# Patient Record
Sex: Male | Born: 1950 | Race: White | Hispanic: No | Smoking: Former smoker
Health system: Southern US, Community
[De-identification: ages and names within clinical notes are randomized; demographics above are authoritative.]

## PROBLEM LIST (undated history)

## (undated) DIAGNOSIS — I10 Essential (primary) hypertension: Secondary | ICD-10-CM

## (undated) DIAGNOSIS — M549 Dorsalgia, unspecified: Secondary | ICD-10-CM

## (undated) DIAGNOSIS — J449 Chronic obstructive pulmonary disease, unspecified: Secondary | ICD-10-CM

## (undated) DIAGNOSIS — IMO0002 Reserved for concepts with insufficient information to code with codable children: Secondary | ICD-10-CM

## (undated) DIAGNOSIS — F329 Major depressive disorder, single episode, unspecified: Secondary | ICD-10-CM

## (undated) DIAGNOSIS — F32A Depression, unspecified: Secondary | ICD-10-CM

## (undated) DIAGNOSIS — I252 Old myocardial infarction: Secondary | ICD-10-CM

## (undated) DIAGNOSIS — J189 Pneumonia, unspecified organism: Secondary | ICD-10-CM

## (undated) HISTORY — PX: ROTATOR CUFF REPAIR: SHX139

## (undated) HISTORY — PX: NECK SURGERY: SHX720

## (undated) HISTORY — PX: BACK SURGERY: SHX140

## (undated) HISTORY — PX: CORONARY ANGIOPLASTY WITH STENT PLACEMENT: SHX49

---

## 1998-03-17 DIAGNOSIS — I252 Old myocardial infarction: Secondary | ICD-10-CM

## 1998-03-17 HISTORY — DX: Old myocardial infarction: I25.2

## 2011-07-14 ENCOUNTER — Emergency Department (HOSPITAL_COMMUNITY)
Admission: EM | Admit: 2011-07-14 | Discharge: 2011-07-15 | Disposition: A | Discharge status: Home / Self Care | Payer: Medicare Other | Attending: Emergency Medicine | Admitting: Emergency Medicine

## 2011-07-14 ENCOUNTER — Encounter (HOSPITAL_COMMUNITY): Payer: Self-pay | Admitting: Emergency Medicine

## 2011-07-14 DIAGNOSIS — J449 Chronic obstructive pulmonary disease, unspecified: Secondary | ICD-10-CM | POA: Insufficient documentation

## 2011-07-14 DIAGNOSIS — J4489 Other specified chronic obstructive pulmonary disease: Secondary | ICD-10-CM | POA: Insufficient documentation

## 2011-07-14 DIAGNOSIS — R0602 Shortness of breath: Secondary | ICD-10-CM | POA: Insufficient documentation

## 2011-07-14 DIAGNOSIS — M542 Cervicalgia: Secondary | ICD-10-CM | POA: Insufficient documentation

## 2011-07-14 DIAGNOSIS — R062 Wheezing: Secondary | ICD-10-CM | POA: Insufficient documentation

## 2011-07-14 DIAGNOSIS — I252 Old myocardial infarction: Secondary | ICD-10-CM | POA: Insufficient documentation

## 2011-07-14 HISTORY — DX: Chronic obstructive pulmonary disease, unspecified: J44.9

## 2011-07-14 HISTORY — DX: Old myocardial infarction: I25.2

## 2011-07-14 HISTORY — DX: Reserved for concepts with insufficient information to code with codable children: IMO0002

## 2011-07-14 HISTORY — DX: Dorsalgia, unspecified: M54.9

## 2011-07-14 LAB — BASIC METABOLIC PANEL
Calcium: 9.1 mg/dL (ref 8.4–10.5)
GFR calc non Af Amer: >90 mL/min (ref >90)
Potassium: 4.7 mEq/L (ref 3.5–5.1)
Sodium: 136 mEq/L (ref 135–145)

## 2011-07-14 LAB — DIFFERENTIAL
Lymphocytes Relative: 41 % (ref 12–46)
Lymphs Abs: 4.2 10*3/uL — ABNORMAL HIGH (ref 0.7–4.0)
Neutro Abs: 4.5 10*3/uL (ref 1.7–7.7)
Neutrophils Relative %: 44 % (ref 43–77)

## 2011-07-14 LAB — CBC
Hemoglobin: 16 g/dL (ref 13.0–17.0)
Platelets: 170 10*3/uL (ref 150–400)
RBC: 5.22 MIL/uL (ref 4.22–5.81)
WBC: 11.1 10*3/uL — ABNORMAL HIGH (ref 4.0–10.5)

## 2011-07-14 MED ORDER — ALBUTEROL (5 MG/ML) CONTINUOUS INHALATION SOLN
INHALATION_SOLUTION | RESPIRATORY_TRACT | Status: AC
  Administered 2011-07-14: 2.5 mg via RESPIRATORY_TRACT
  Filled 2011-07-14: qty 20

## 2011-07-14 MED ORDER — ALBUTEROL SULFATE (5 MG/ML) 0.5% IN NEBU
15.0000 mg | INHALATION_SOLUTION | Freq: Once | RESPIRATORY_TRACT | Status: AC
  Administered 2011-07-14: 2.5 mg via RESPIRATORY_TRACT
  Filled 2011-07-14: qty 0.5
  Filled 2011-07-14: qty 3

## 2011-07-14 MED ORDER — IPRATROPIUM BROMIDE 0.02 % IN SOLN
1.0000 mg | Freq: Once | RESPIRATORY_TRACT | Status: AC
  Administered 2011-07-14: 0.5 mg via RESPIRATORY_TRACT
  Filled 2011-07-14 (×2): qty 2.5

## 2011-07-14 MED ORDER — PREDNISONE 20 MG PO TABS
60.0000 mg | ORAL_TABLET | Freq: Once | ORAL | Status: AC
  Administered 2011-07-14: 60 mg via ORAL
  Filled 2011-07-14: qty 3

## 2011-07-14 NOTE — Discharge Instructions (Signed)
Chronic Obstructive Pulmonary Disease Chronic obstructive pulmonary disease (COPD) is a condition in which airflow from the lungs is restricted. The lungs can never return to normal, but there are measures you can take which will improve them and make you feel better. CAUSES   Smoking.   Exposure to secondhand smoke.   Breathing in irritants (pollution, cigarette smoke, strong smells, aerosol sprays, paint fumes).   History of lung infections.  TREATMENT  Treatment focuses on making you comfortable (supportive care). Your caregiver may prescribe medications (inhaled or pills) to help improve your breathing. HOME CARE INSTRUCTIONS   If you smoke, stop smoking.   Avoid exposure to smoke, chemicals, and fumes that aggravate your breathing.   Take antibiotic medicines as directed by your caregiver.   Avoid medicines that dry up your system and slow down the elimination of secretions (antihistamines and cough syrups). This decreases respiratory capacity and may lead to infections.   Drink enough water and fluids to keep your urine clear or pale yellow. This loosens secretions.   Use humidifiers at home and at your bedside if they do not make breathing difficult.   Receive all protective vaccines your caregiver suggests, especially pneumococcal and influenza.   Use home oxygen as suggested.   Stay active. Exercise and physical activity will help maintain your ability to do things you want to do.   Eat a healthy diet.  SEEK MEDICAL CARE IF:   You develop pus-like mucus (sputum).   Breathing is more labored or exercise becomes difficult to do.   You are running out of the medicine you take for your breathing.  SEEK IMMEDIATE MEDICAL CARE IF:   You have a rapid heart rate.   You have agitation, confusion, tremors, or are in a stupor (family members may need to observe this).   It becomes difficult to breathe.   You develop chest pain.   You have a fever.  MAKE SURE YOU:    Understand these instructions.   Will watch your condition.   Will get help right away if you are not doing well or get worse.  Document Released: 12/11/2004 Document Revised: 02/20/2011 Document Reviewed: 05/03/2010 [facility] Patient Information 2012 [place], [place]. Use your albuterol inhaler or nebulizer every 4 hours as needed for shortness of breath. Return if needed more than every 4 hours. You need to stop smoking. Call [facility] to get a primary care Doctor locally. Asking you Dr. to help you to stop smoking.

## 2011-07-14 NOTE — ED Notes (Signed)
RT at bedside to administer continuous neb.

## 2011-07-14 NOTE — ED Notes (Signed)
PT reports COPD exacerbation; sating normally right now. PT reports using breathing tx at home but not helping right now. PT is sweaty and wheezing but able to talk without SOB.

## 2011-07-14 NOTE — ED Provider Notes (Addendum)
History     CSN: [id number]  Arrival date & time 07/14/11  1478   First MD Initiated Contact with Patient 07/14/11 2058      Chief Complaint  Patient presents with  . Shortness of Breath    (Consider location/radiation/quality/duration/timing/severity/associated sxs/prior treatment) HPI Complains of wheezing and shortness of breath typical of COPD with nonproductive cough onset one week ago feels like COPD he's had in the past also complains of sinus congestion and ear congestion for approximately one week no known fever. Patient has been treating himself with his home albuterol nebulizers with partial relief. No chest pain no other associated symptoms  Past Medical History  Diagnosis Date  . COPD (chronic obstructive pulmonary disease)   . Disc degeneration   . Back pain   . MI, old     2000; stent placed.     No past surgical history on file.  No family history on file.  History  Substance Use Topics  . Smoking status: Not on file  . Smokeless tobacco: Not on file  . Alcohol Use:     current smoker ,no alcohol no drug  Review of Systems  HENT: Positive for neck pain.        Chronic neck pain patient is currently followed by a pain clinic at [facility], sinus congestion and ear congestion    Allergies  Review of patient's allergies indicates no known allergies.  Home Medications   Current Outpatient Rx  Name Route Sig Dispense Refill  . ALBUTEROL SULFATE (2.5 MG/3ML) 0.083% IN NEBU Nebulization Take 2.5 mg by nebulization every 4 (four) hours as needed. For shortness of breath    . DICLOFENAC SODIUM 75 MG PO TBEC Oral Take 75 mg by mouth 2 (two) times daily.    Marland Kitchen FLUOXETINE HCL 40 MG PO CAPS Oral Take 40 mg by mouth daily.    . GUAIFENESIN 200 MG PO TABS Oral Take 400 mg by mouth every 4 (four) hours as needed. For cough    . IBUPROFEN 200 MG PO TABS Oral Take 400 mg by mouth every 6 (six) hours as needed. For pain    . SIMVASTATIN 20 MG PO TABS  Oral Take 40 mg by mouth every evening.      BP 152/88  Pulse 74  Temp(Src) 98.2 F (36.8 C) (Oral)  SpO2 96%  Physical Exam  Nursing note and vitals reviewed. Constitutional: He appears well-developed and well-nourished. No distress.  HENT:  Head: Normocephalic and atraumatic.  Right Ear: External ear normal.  Left Ear: External ear normal.       Bilateral tympanic membranes normal  Eyes: Conjunctivae are normal. Pupils are equal, round, and reactive to light.  Neck: Neck supple. No tracheal deviation present. No thyromegaly present.  Cardiovascular: Normal rate and regular rhythm.   No murmur heard. Pulmonary/Chest: Effort normal. He has wheezes.       Speaks in paragraphs, prolonged expiratory phase with extra wheeze  Abdominal: Soft. Bowel sounds are normal. He exhibits no distension. There is no tenderness.  Musculoskeletal: Normal range of motion. He exhibits no edema and no tenderness.  Neurological: He is alert. Coordination normal.  Skin: Skin is warm and dry. No rash noted.  Psychiatric: He has a normal mood and affect.    ED Course  Procedures (including critical care time)  Labs Reviewed - No data to display No results found.  Date: 07/14/2011  Rate: 75  Rhythm: normal sinus rhythm  QRS Axis: normal  Intervals: normal  ST/T Wave abnormalities: nonspecific T wave changes  Conduction Disutrbances:none  Narrative Interpretation:   Old EKG Reviewed: none available Results for orders placed during the hospital encounter of 07/14/11  BASIC METABOLIC PANEL      Component Value Range   Sodium 136  135 - 145 (mEq/L)   Potassium 4.7  3.5 - 5.1 (mEq/L)   Chloride 102  96 - 112 (mEq/L)   CO2 22  19 - 32 (mEq/L)   Glucose, Bld 85  70 - 99 (mg/dL)   BUN 25 (*) 6 - 23 (mg/dL)   Creatinine, Ser 1.19  0.50 - 1.35 (mg/dL)   Calcium 9.1  8.4 - 14.7 (mg/dL)   GFR calc non Af Amer >90  >90 (mL/min)   GFR calc Af Amer >90  >90 (mL/min)  CBC      Component Value Range     WBC 11.1 (*) 4.0 - 10.5 (K/uL)   RBC 5.22  4.22 - 5.81 (MIL/uL)   Hemoglobin 16.0  13.0 - 17.0 (g/dL)   HCT 82.9  56.2 - 13.0 (%)   MCV 87.4  78.0 - 100.0 (fL)   MCH 30.7  26.0 - 34.0 (pg)   MCHC 35.1  30.0 - 36.0 (g/dL)   RDW 86.5  78.4 - 69.6 (%)   Platelets 170  150 - 400 (K/uL)  DIFFERENTIAL      Component Value Range   Neutrophils Relative 44  43 - 77 (%)   Neutro Abs 4.5  1.7 - 7.7 (K/uL)   Lymphocytes Relative 41  12 - 46 (%)   Lymphs Abs 4.2 (*) 0.7 - 4.0 (K/uL)   Monocytes Relative 8  3 - 12 (%)   Monocytes Absolute 0.8  0.1 - 1.0 (K/uL)   Eosinophils Relative 6 (*) 0 - 5 (%)   Eosinophils Absolute 0.6  0.0 - 0.7 (K/uL)   Basophils Relative 1  0 - 1 (%)   Basophils Absolute 0.1  0.0 - 0.1 (K/uL)   No results found.   No diagnosis found.  11:25 PM breathing is normal per patient. Patient speaks in paragraphs lungs clear to auscultation after treatment with albuterol and Atrovent nebulizer and prednisone MDM  Plan patient encouraged to stop smoking Prescription 12 day prednisone taper Touse albuterol nebulizer every 4 hours return if needed more than every 4 hours Referral [facility] Diagnosis #1 exacerbation COPD #2 tobacco abuse      Doug Sou, MD 07/14/11 2952  Doug Sou, MD 07/15/11 [id number]

## 2011-07-14 NOTE — ED Notes (Signed)
PT seen at [facility] hospital and was at pain clinic but no longer.

## 2011-07-14 NOTE — ED Notes (Signed)
Pt states that he has no doctor right now and his COPD "is acting up" and he needs steroids. Pt states he used 5 breathing treatments today and experienced no relief. Is wheezing but does not appear SOB.

## 2016-06-15 DIAGNOSIS — J189 Pneumonia, unspecified organism: Secondary | ICD-10-CM

## 2016-06-15 HISTORY — DX: Pneumonia, unspecified organism: J18.9

## 2016-07-21 ENCOUNTER — Inpatient Hospital Stay (HOSPITAL_COMMUNITY)
Admission: EM | Admit: 2016-07-21 | Discharge: 2016-07-24 | DRG: 191 | Disposition: A | Discharge status: Home / Self Care | Payer: Medicare Other | Attending: Family Medicine | Admitting: Family Medicine

## 2016-07-21 ENCOUNTER — Emergency Department (HOSPITAL_COMMUNITY): Payer: Medicare Other

## 2016-07-21 ENCOUNTER — Encounter (HOSPITAL_COMMUNITY): Payer: Self-pay | Admitting: *Deleted

## 2016-07-21 DIAGNOSIS — E871 Hypo-osmolality and hyponatremia: Secondary | ICD-10-CM

## 2016-07-21 DIAGNOSIS — G8929 Other chronic pain: Secondary | ICD-10-CM | POA: Diagnosis present

## 2016-07-21 DIAGNOSIS — I2510 Atherosclerotic heart disease of native coronary artery without angina pectoris: Secondary | ICD-10-CM | POA: Diagnosis present

## 2016-07-21 DIAGNOSIS — Z79891 Long term (current) use of opiate analgesic: Secondary | ICD-10-CM | POA: Diagnosis not present

## 2016-07-21 DIAGNOSIS — F329 Major depressive disorder, single episode, unspecified: Secondary | ICD-10-CM | POA: Diagnosis present

## 2016-07-21 DIAGNOSIS — Z66 Do not resuscitate: Secondary | ICD-10-CM | POA: Diagnosis present

## 2016-07-21 DIAGNOSIS — M479 Spondylosis, unspecified: Secondary | ICD-10-CM | POA: Diagnosis present

## 2016-07-21 DIAGNOSIS — Z955 Presence of coronary angioplasty implant and graft: Secondary | ICD-10-CM | POA: Diagnosis not present

## 2016-07-21 DIAGNOSIS — F1120 Opioid dependence, uncomplicated: Secondary | ICD-10-CM

## 2016-07-21 DIAGNOSIS — M1990 Unspecified osteoarthritis, unspecified site: Secondary | ICD-10-CM | POA: Diagnosis present

## 2016-07-21 DIAGNOSIS — F419 Anxiety disorder, unspecified: Secondary | ICD-10-CM | POA: Diagnosis present

## 2016-07-21 DIAGNOSIS — Z79899 Other long term (current) drug therapy: Secondary | ICD-10-CM

## 2016-07-21 DIAGNOSIS — Z87891 Personal history of nicotine dependence: Secondary | ICD-10-CM | POA: Diagnosis not present

## 2016-07-21 DIAGNOSIS — J441 Chronic obstructive pulmonary disease with (acute) exacerbation: Secondary | ICD-10-CM | POA: Diagnosis present

## 2016-07-21 DIAGNOSIS — I252 Old myocardial infarction: Secondary | ICD-10-CM | POA: Diagnosis not present

## 2016-07-21 DIAGNOSIS — G894 Chronic pain syndrome: Secondary | ICD-10-CM | POA: Diagnosis not present

## 2016-07-21 DIAGNOSIS — I10 Essential (primary) hypertension: Secondary | ICD-10-CM

## 2016-07-21 DIAGNOSIS — F1010 Alcohol abuse, uncomplicated: Secondary | ICD-10-CM | POA: Diagnosis present

## 2016-07-21 HISTORY — DX: Pneumonia, unspecified organism: J18.9

## 2016-07-21 HISTORY — DX: Depression, unspecified: F32.A

## 2016-07-21 HISTORY — DX: Major depressive disorder, single episode, unspecified: F32.9

## 2016-07-21 HISTORY — DX: Essential (primary) hypertension: I10

## 2016-07-21 LAB — COMPREHENSIVE METABOLIC PANEL
ALBUMIN: 4.9 g/dL (ref 3.5–5.0)
ALT: 29 U/L (ref 17–63)
AST: 33 U/L (ref 15–41)
Alkaline Phosphatase: 57 U/L (ref 38–126)
Anion gap: 11 (ref 5–15)
BUN: 14 mg/dL (ref 6–20)
CHLORIDE: 96 mmol/L — AB (ref 101–111)
CO2: 21 mmol/L — AB (ref 22–32)
CREATININE: 1.09 mg/dL (ref 0.61–1.24)
Calcium: 9.7 mg/dL (ref 8.9–10.3)
GFR calc Af Amer: >60 mL/min (ref >60)
GFR calc non Af Amer: >60 mL/min (ref >60)
GLUCOSE: 108 mg/dL — AB (ref 65–99)
Potassium: 4.9 mmol/L (ref 3.5–5.1)
Sodium: 128 mmol/L — ABNORMAL LOW (ref 135–145)
Total Bilirubin: 1.1 mg/dL (ref 0.3–1.2)
Total Protein: 7.8 g/dL (ref 6.5–8.1)

## 2016-07-21 LAB — I-STAT VENOUS BLOOD GAS, ED
Acid-base deficit: 4 mmol/L — ABNORMAL HIGH (ref 0.0–2.0)
Acid-base deficit: 3 mmol/L — ABNORMAL HIGH (ref 0.0–2.0)
BICARBONATE: 22.6 mmol/L (ref 20.0–28.0)
Bicarbonate: 24.2 mmol/L (ref 20.0–28.0)
O2 SAT: 74 %
O2 Saturation: 78 %
PCO2 VEN: 54.1 mmHg (ref 44.0–60.0)
PCO2 VEN: 39.4 mmHg — AB (ref 44.0–60.0)
PH VEN: 7.366 (ref 7.250–7.430)
TCO2: 26 mmol/L (ref 0–100)
TCO2: 24 mmol/L (ref 0–100)
pH, Ven: 7.259 (ref 7.250–7.430)
pO2, Ven: 46 mmHg — ABNORMAL HIGH (ref 32.0–45.0)
pO2, Ven: 44 mmHg (ref 32.0–45.0)

## 2016-07-21 LAB — CBC WITH DIFFERENTIAL/PLATELET
BASOS PCT: 1 %
Basophils Absolute: 0.2 10*3/uL — ABNORMAL HIGH (ref 0.0–0.1)
Eosinophils Absolute: 1 10*3/uL — ABNORMAL HIGH (ref 0.0–0.7)
Eosinophils Relative: 6 %
HCT: 47.1 % (ref 39.0–52.0)
Hemoglobin: 16.1 g/dL (ref 13.0–17.0)
Lymphocytes Relative: 27 %
Lymphs Abs: 4.5 10*3/uL — ABNORMAL HIGH (ref 0.7–4.0)
MCH: 30.6 pg (ref 26.0–34.0)
MCHC: 34.2 g/dL (ref 30.0–36.0)
MCV: 89.4 fL (ref 78.0–100.0)
MONO ABS: 1.2 10*3/uL — AB (ref 0.1–1.0)
Monocytes Relative: 7 %
NEUTROS ABS: 9.8 10*3/uL — AB (ref 1.7–7.7)
NEUTROS PCT: 59 %
PLATELETS: 211 10*3/uL (ref 150–400)
RBC: 5.27 MIL/uL (ref 4.22–5.81)
RDW: 14.3 % (ref 11.5–15.5)
WBC: 16.7 10*3/uL — ABNORMAL HIGH (ref 4.0–10.5)

## 2016-07-21 LAB — CBC
HCT: 44 % (ref 39.0–52.0)
HEMOGLOBIN: 15 g/dL (ref 13.0–17.0)
MCH: 30.3 pg (ref 26.0–34.0)
MCHC: 34.1 g/dL (ref 30.0–36.0)
MCV: 88.9 fL (ref 78.0–100.0)
Platelets: 221 10*3/uL (ref 150–400)
RBC: 4.95 MIL/uL (ref 4.22–5.81)
RDW: 14.1 % (ref 11.5–15.5)
WBC: 9.8 10*3/uL (ref 4.0–10.5)

## 2016-07-21 LAB — CREATININE, SERUM
Creatinine, Ser: 1.4 mg/dL — ABNORMAL HIGH (ref 0.61–1.24)
GFR calc non Af Amer: 52 mL/min — ABNORMAL LOW (ref >60)

## 2016-07-21 LAB — LIPID PANEL
Cholesterol: 217 mg/dL — ABNORMAL HIGH (ref 0–200)
HDL: 49 mg/dL (ref >40)
LDL CALC: 129 mg/dL — AB (ref 0–99)
TRIGLYCERIDES: 193 mg/dL — AB (ref <150)
Total CHOL/HDL Ratio: 4.4 RATIO
VLDL: 39 mg/dL (ref 0–40)

## 2016-07-21 LAB — TROPONIN I
TROPONIN I: 0.03 ng/mL — AB (ref <0.03)
Troponin I: 0.04 ng/mL (ref <0.03)

## 2016-07-21 LAB — BRAIN NATRIURETIC PEPTIDE: B Natriuretic Peptide: 89.7 pg/mL (ref 0.0–100.0)

## 2016-07-21 MED ORDER — FLUOXETINE HCL 20 MG PO CAPS
40.0000 mg | ORAL_CAPSULE | Freq: Every day | ORAL | Status: DC
  Administered 2016-07-21 – 2016-07-24 (×4): 40 mg via ORAL
  Filled 2016-07-21 (×4): qty 2

## 2016-07-21 MED ORDER — FOLIC ACID 1 MG PO TABS
1.0000 mg | ORAL_TABLET | Freq: Every day | ORAL | Status: DC
  Administered 2016-07-21 – 2016-07-24 (×4): 1 mg via ORAL
  Filled 2016-07-21 (×4): qty 1

## 2016-07-21 MED ORDER — GABAPENTIN 600 MG PO TABS
900.0000 mg | ORAL_TABLET | Freq: Two times a day (BID) | ORAL | Status: DC
  Administered 2016-07-21 – 2016-07-24 (×6): 900 mg via ORAL
  Filled 2016-07-21 (×6): qty 2

## 2016-07-21 MED ORDER — DOCUSATE SODIUM 100 MG PO CAPS
100.0000 mg | ORAL_CAPSULE | Freq: Two times a day (BID) | ORAL | Status: DC
  Administered 2016-07-21 – 2016-07-24 (×6): 100 mg via ORAL
  Filled 2016-07-21 (×6): qty 1

## 2016-07-21 MED ORDER — ALBUTEROL (5 MG/ML) CONTINUOUS INHALATION SOLN
10.0000 mg/h | INHALATION_SOLUTION | RESPIRATORY_TRACT | Status: DC
  Administered 2016-07-21: 10 mg/h via RESPIRATORY_TRACT
  Filled 2016-07-21: qty 20

## 2016-07-21 MED ORDER — DOXYCYCLINE HYCLATE 100 MG PO TABS
100.0000 mg | ORAL_TABLET | Freq: Once | ORAL | Status: AC
  Administered 2016-07-21: 100 mg via ORAL
  Filled 2016-07-21: qty 1

## 2016-07-21 MED ORDER — ACETAMINOPHEN 325 MG PO TABS
650.0000 mg | ORAL_TABLET | Freq: Four times a day (QID) | ORAL | Status: DC | PRN
  Administered 2016-07-22: 650 mg via ORAL
  Filled 2016-07-21: qty 2

## 2016-07-21 MED ORDER — PREDNISOLONE 5 MG PO TABS
50.0000 mg | ORAL_TABLET | Freq: Every day | ORAL | Status: DC
  Administered 2016-07-22 – 2016-07-24 (×3): 50 mg via ORAL
  Filled 2016-07-21 (×3): qty 10

## 2016-07-21 MED ORDER — IPRATROPIUM-ALBUTEROL 0.5-2.5 (3) MG/3ML IN SOLN
3.0000 mL | RESPIRATORY_TRACT | Status: DC
  Administered 2016-07-21 – 2016-07-23 (×9): 3 mL via RESPIRATORY_TRACT
  Filled 2016-07-21 (×9): qty 3

## 2016-07-21 MED ORDER — AMOXICILLIN 500 MG PO CAPS
1000.0000 mg | ORAL_CAPSULE | Freq: Once | ORAL | Status: AC
  Administered 2016-07-21: 1000 mg via ORAL
  Filled 2016-07-21: qty 2

## 2016-07-21 MED ORDER — DIPHENHYDRAMINE HCL 25 MG PO CAPS
25.0000 mg | ORAL_CAPSULE | Freq: Once | ORAL | Status: AC
  Administered 2016-07-21: 25 mg via ORAL
  Filled 2016-07-21: qty 1

## 2016-07-21 MED ORDER — POLYETHYLENE GLYCOL 3350 17 G PO PACK: 17.0000 g | PACK | Freq: Every day | ORAL | Status: DC | PRN

## 2016-07-21 MED ORDER — LORAZEPAM 1 MG PO TABS: 1.0000 mg | ORAL_TABLET | Freq: Four times a day (QID) | ORAL | Status: AC | PRN

## 2016-07-21 MED ORDER — LORAZEPAM 2 MG/ML IJ SOLN
1.0000 mg | Freq: Once | INTRAMUSCULAR | Status: AC
  Administered 2016-07-21: 1 mg via INTRAVENOUS
  Filled 2016-07-21: qty 1

## 2016-07-21 MED ORDER — METHADONE HCL 10 MG PO TABS
10.0000 mg | ORAL_TABLET | Freq: Every day | ORAL | Status: DC
  Administered 2016-07-22 – 2016-07-24 (×3): 10 mg via ORAL
  Filled 2016-07-21 (×3): qty 1

## 2016-07-21 MED ORDER — GABAPENTIN 600 MG PO TABS: 900.0000 mg | ORAL_TABLET | Freq: Two times a day (BID) | ORAL | Status: DC

## 2016-07-21 MED ORDER — ASPIRIN 81 MG PO CHEW
324.0000 mg | CHEWABLE_TABLET | Freq: Once | ORAL | Status: AC
  Administered 2016-07-21: 324 mg via ORAL
  Filled 2016-07-21: qty 4

## 2016-07-21 MED ORDER — SALINE SPRAY 0.65 % NA SOLN
1.0000 | NASAL | Status: DC | PRN
  Administered 2016-07-24: 1 via NASAL
  Filled 2016-07-21 (×2): qty 44

## 2016-07-21 MED ORDER — THIAMINE HCL 100 MG/ML IJ SOLN: 100.0000 mg | Freq: Every day | INTRAMUSCULAR | Status: DC

## 2016-07-21 MED ORDER — IPRATROPIUM BROMIDE 0.02 % IN SOLN
0.5000 mg | Freq: Once | RESPIRATORY_TRACT | Status: AC
  Administered 2016-07-21: 0.5 mg via RESPIRATORY_TRACT
  Filled 2016-07-21: qty 2.5

## 2016-07-21 MED ORDER — DOXYCYCLINE HYCLATE 100 MG PO TABS
100.0000 mg | ORAL_TABLET | Freq: Two times a day (BID) | ORAL | Status: DC
  Administered 2016-07-22 – 2016-07-24 (×5): 100 mg via ORAL
  Filled 2016-07-21 (×5): qty 1

## 2016-07-21 MED ORDER — SIMVASTATIN 40 MG PO TABS
40.0000 mg | ORAL_TABLET | Freq: Every evening | ORAL | Status: DC
  Administered 2016-07-21 – 2016-07-22 (×2): 40 mg via ORAL
  Filled 2016-07-21 (×2): qty 1

## 2016-07-21 MED ORDER — LEVOFLOXACIN IN D5W 750 MG/150ML IV SOLN
750.0000 mg | INTRAVENOUS | Status: DC
Start: 2016-07-21 — End: 2016-07-21
  Administered 2016-07-21: 750 mg via INTRAVENOUS
  Filled 2016-07-21: qty 150

## 2016-07-21 MED ORDER — OXYMETAZOLINE HCL 0.05 % NA SOLN
1.0000 | Freq: Two times a day (BID) | NASAL | Status: DC | PRN
  Administered 2016-07-21 – 2016-07-24 (×2): 1 via NASAL
  Filled 2016-07-21 (×2): qty 15

## 2016-07-21 MED ORDER — ACETAMINOPHEN 650 MG RE SUPP: 650.0000 mg | Freq: Four times a day (QID) | RECTAL | Status: DC | PRN

## 2016-07-21 MED ORDER — LEVOFLOXACIN 750 MG PO TABS: 750.0000 mg | ORAL_TABLET | Freq: Once | ORAL | Status: DC

## 2016-07-21 MED ORDER — ALBUTEROL SULFATE (2.5 MG/3ML) 0.083% IN NEBU
2.5000 mg | INHALATION_SOLUTION | RESPIRATORY_TRACT | Status: DC | PRN
  Administered 2016-07-22 – 2016-07-24 (×6): 2.5 mg via RESPIRATORY_TRACT
  Filled 2016-07-21 (×7): qty 3

## 2016-07-21 MED ORDER — ADULT MULTIVITAMIN W/MINERALS CH
1.0000 | ORAL_TABLET | Freq: Every day | ORAL | Status: DC
  Administered 2016-07-21 – 2016-07-24 (×4): 1 via ORAL
  Filled 2016-07-21 (×4): qty 1

## 2016-07-21 MED ORDER — LORAZEPAM 2 MG/ML IJ SOLN
1.0000 mg | Freq: Four times a day (QID) | INTRAMUSCULAR | Status: AC | PRN
  Administered 2016-07-21 – 2016-07-23 (×4): 1 mg via INTRAVENOUS
  Filled 2016-07-21 (×4): qty 1

## 2016-07-21 MED ORDER — ENOXAPARIN SODIUM 60 MG/0.6ML ~~LOC~~ SOLN
50.0000 mg | SUBCUTANEOUS | Status: DC
  Administered 2016-07-21 – 2016-07-23 (×3): 50 mg via SUBCUTANEOUS
  Filled 2016-07-21 (×3): qty 0.6

## 2016-07-21 MED ORDER — TRAZODONE HCL 50 MG PO TABS
25.0000 mg | ORAL_TABLET | Freq: Every evening | ORAL | Status: DC | PRN
  Administered 2016-07-23: 25 mg via ORAL
  Filled 2016-07-21: qty 1

## 2016-07-21 MED ORDER — IPRATROPIUM-ALBUTEROL 0.5-2.5 (3) MG/3ML IN SOLN
3.0000 mL | Freq: Once | RESPIRATORY_TRACT | Status: AC
  Administered 2016-07-21: 3 mL via RESPIRATORY_TRACT
  Filled 2016-07-21: qty 3

## 2016-07-21 MED ORDER — ENSURE ENLIVE PO LIQD: 237.0000 mL | Freq: Two times a day (BID) | ORAL | Status: DC

## 2016-07-21 MED ORDER — VITAMIN B-1 100 MG PO TABS
100.0000 mg | ORAL_TABLET | Freq: Every day | ORAL | Status: DC
  Administered 2016-07-21 – 2016-07-24 (×4): 100 mg via ORAL
  Filled 2016-07-21 (×4): qty 1

## 2016-07-21 NOTE — ED Notes (Signed)
Applied O2 for sats at 88% with good waveform

## 2016-07-21 NOTE — ED Notes (Signed)
Patient c/o redness and itching at IV site ,Dr. Jeraldine LootsLockwood aware will discontinue IV .

## 2016-07-21 NOTE — Progress Notes (Signed)
1645 Received from ED, A&O x4. Mild SOB on exertion. On O2 at 2L per n.c.  Denies chest pain..Marland Kitchen

## 2016-07-21 NOTE — ED Notes (Signed)
Pt sitting on side of bed for comfort  

## 2016-07-21 NOTE — ED Notes (Signed)
Patient c/o sob ans difficulty breathing for several weeks. States he has been using his neb machines at home without relief. Patient was given albuterol 10mg , Atrovent 1mg , solu-medrol 125 mg and Mag 2gm per ems, upon arrival patient presents with labored resp. However he is able to speak in short sentences. MD at bedside upon arrival . Resp therapy at bedside and patient was placed on bi-pap

## 2016-07-21 NOTE — H&P (Signed)
[facility] and Physical Service Pager: [phone]  Patient name: Austin Maynard Medical record number: [id number] Date of birth: 05/24/1950 Age: 66 y.o. Gender: male  Primary Care Provider: Default, Provider, MD Consultants:  Code Status: DNR/DNI  Chief Complaint: COPD exacerbation  Assessment and Plan: Austin PonderDaniel L Weldin is a 66 y.o. male presenting with 2 weeks of worsening dyspnea. PMH is significant for COPD, recent PNA, DJD, HTN, MI (2000, stent) and Depression with SI.  COPD exacerbation: patient with gradual worsening of dyspnea over the last 2 weeks, with acute worsening today. Now with the cardinal symptoms (cough, dyspnea and sputum production). EMS gave Albuterol 10mg , Atrovent 1mg , Solumedrol 125mg  and Mag 2mg . Satting 88% on room air so started on Bi-Pap then weaned to nasal cannula on 2L and satting at 96%. Levaquin started in ED. exam with diffuse wheezing bilaterally. No crackles.  Leukocytosis 16.7, likely due to steroid. He is afebrile. CXR without focal infiltration. ?Diffuse opacity and ?cephalization. No signs of fluid overload. BNP 89 to think of CHF. PE unlikely with well's score of 1.5 (tachycardia although this could be due to a COPD exacerbation and albuterol). Doubt ACS but can't completely rule out given history of CAD with  - Admit to Med Surg. Admitting Dr. Jennette KettleNeal - DuoNebs q4h - PRN Albuterol q2h - Continue Levaquin for total of 5 days (5/7-) - Prednisone 50 mg starting 5/8. Received Solu-Medrol 5/7 - Cycle troponin and EKG. - Urine Legionella - PFT as outpatient - Will start LAMA/LABA once breathing is okay  Hyponatremia: Na 128. Appears euvolemic.  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 Seen at pain clinic for opiod management, but [facility] [place]. Takes methadone, gabapentin and ibuprofen. - [place]  [place] [facility] - Continue home Gabapentin 300mg   HTN: History of HTN. Unclear if on medications at home. Hypertensive (they hypotensive) upon arrival to ED. Currently normotensive.  - Monitor and consider adding agent if needed.   CAD s/p stent:  in 2000. Denies chest pain. Dyspnea likely due to COPD exacerbation. - Lipid panel - Continue home simvastatin - Cycle troponin and EKG  Depression: Says wanted to kill himself instead of calling EMS when he felt dyspneic. Denies ongoing SI now. Has loaded gun in home. On Fluoxetine at home.  - Continue home Fluoxetine  Alcohol abuse: Drinks 24oz of beer daily. Denies ever experiencing withdrawal symptoms.  - CIWA protocol  FEN/GI: Regular diet.  Prophylaxis: SCDs.   Disposition: Admit to floor.   History of Present Illness:  Austin PonderDaniel L Maynard is a 66 y.o. male presenting with COPD exacerbation in the setting of recent pneumonia 4 weeks ago. At that time he was treated with antibiotics and steroids [facility] Paulla DollyEnrique P[facility]. Patient noted improvement and then gradual worsening over weeks with cough, shortness of breath, non-bloody sputum production. Worsening over the past 2 weeks despite around-the-clock home albuterol treatments. During that time, he considered killing himself with the loaded gun he has at home. Acutely worsened today and he called 911. EMS gave Albuterol 10mg , Atrovent 1mg , Solumedrol 125mg  and Mag 2mg . In the ED, he was satting 88% on room air and so was started on Bi-Pap then weaned to nasal cannula on 2L and satting at 96%. He also was started on Levaquin in the ED. No chest pain or  swelling in legs. No trouble lying flat. No nausea, vomiting, diarrhea, fever, chills or dysuria.   Review Of Systems: Per HPI with the following additions:   Review of Systems  Constitutional: Positive for diaphoresis and malaise/fatigue. Negative for chills, fever and weight loss.  Respiratory:  Positive for cough, sputum production, shortness of breath and wheezing. Negative for hemoptysis.   Cardiovascular: Negative for chest pain, palpitations, claudication and leg swelling.  Gastrointestinal: Negative for abdominal pain, blood in stool, constipation, diarrhea, heartburn, nausea and vomiting.  Genitourinary: Negative for dysuria and flank pain.  Psychiatric/Behavioral: Positive for depression, substance abuse and suicidal ideas.    There are no active problems to display for this patient.   Past Medical History: Past Medical History:  Diagnosis Date  . Back pain   . COPD (chronic obstructive pulmonary disease) (HCC)   . Disc degeneration   . Hypertension   . MI, old    2000; stent placed.     Past Surgical History: Past Surgical History:  Procedure Laterality Date  . BACK SURGERY    . NECK SURGERY    . ROTATOR CUFF REPAIR      Social History: Social History  Substance Use Topics  . Smoking status: Former Games developer  . Smokeless tobacco: Never Used  . Alcohol use Yes   Additional social history: Lives alone in a house. Used to smoke "more than I would like to say" but has stopped smoking for the last 5 years. Drinks 24oz of beer daily. Denies illicit drug use.  Please also refer to relevant sections of EMR.  Family History: No family history on file.   Allergies and Medications: No Known Allergies No current facility-administered medications on file prior to encounter.    Current Outpatient Prescriptions on File Prior to Encounter  Medication Sig Dispense Refill  . albuterol (PROVENTIL) (2.5 MG/3ML) 0.083% nebulizer solution Take 2.5 mg by nebulization every 4 (four) hours as needed. For shortness of breath    . FLUoxetine (PROZAC) 40 MG capsule Take 60 mg by mouth daily.     Marland Kitchen guaiFENesin 200 MG tablet Take 400 mg by mouth every 4 (four) hours as needed. For cough    . ibuprofen (ADVIL,MOTRIN) 200 MG tablet Take 400 mg by mouth every 6 (six) hours as  needed. For pain    . simvastatin (ZOCOR) 20 MG tablet Take 40 mg by mouth every evening.      Objective: BP (!) 190/129 (BP Location: Right Arm)   Pulse (!) 103   Temp 97 F (36.1 C) (Oral)   Resp (!) 30   Ht 5\' 11"  (1.803 m)   Wt 230 lb (104.3 kg)   SpO2 100%   BMI 32.08 kg/m  Exam: General: Ill appearing man, heavily mouth breathing with nasal cannula in place Eyes: PERRLA ENTM: Missing teeth, poor dentition. Unable to open mouth enough to reveal uvula Cardiovascular: RRR, hard to assess for murmurs due to loud wheezing. No edema Respiratory: mild increased work of breathing, diffuse, loud expiratory wheezes throughout all lung fields. No crackles. Gastrointestinal: Distended, tympanitic abdomen. Non tender, rigid to palpation. Neuro: alert and oriented. Long-winded answers. Psych: Melancholy. No SI  Labs and Imaging: CBC BMET   Recent Labs Lab 07/21/16 1030  WBC 16.7*  HGB 16.1  HCT 47.1  PLT 211    Recent Labs Lab 07/21/16 1030  NA 128*  K 4.9  CL 96*  CO2 21*  BUN 14  CREATININE 1.09  GLUCOSE 108*  CALCIUM 9.7  CXR IMPRESSION: Mild hyperinflation.  No active disease.  Sandria Manly, Medical Student 07/21/2016, 2:05 PM MS4, [facility] FPTS Intern pager: [phone], text pages welcome   I have seen and evaluated the patient with Student Dr. Kela Millin. I am in agreement with the note above in its revised form. My additions are in red.  Candelaria Stagers, MD, PGY-2 07/21/2016 4:16 PM

## 2016-07-21 NOTE — ED Provider Notes (Signed)
[facility]-EMERGENCY DEPT Provider Note   CSN: [id number] Arrival date & time: 07/21/16  1040     History   Chief Complaint Chief Complaint  Patient presents with  . Shortness of Breath    HPI Austin Maynard is a 66 y.o. male.  The history is provided by the patient and the EMS personnel.  Shortness of Breath  This is a recurrent problem. The average episode lasts 1 week. The problem occurs intermittently.The current episode started more than 2 days ago. The problem has been rapidly worsening. Associated symptoms include cough and wheezing. Pertinent negatives include no fever, no headaches, no chest pain, no vomiting and no abdominal pain. He has tried beta-agonist inhalers for the symptoms. The treatment provided mild relief.    Past Medical History:  Diagnosis Date  . Back pain   . COPD (chronic obstructive pulmonary disease) (HCC)   . Disc degeneration   . Hypertension   . MI, old    2000; stent placed.     There are no active problems to display for this patient.   Past Surgical History:  Procedure Laterality Date  . BACK SURGERY    . NECK SURGERY    . ROTATOR CUFF REPAIR         Home Medications    Prior to Admission medications   Medication Sig Start Date End Date Taking? Authorizing Provider  albuterol (PROVENTIL) (2.5 MG/3ML) 0.083% nebulizer solution Take 2.5 mg by nebulization every 4 (four) hours as needed. For shortness of breath   Yes [provider]  FLUoxetine (PROZAC) 40 MG capsule Take 60 mg by mouth daily.    Yes [provider]  guaiFENesin 200 MG tablet Take 400 mg by mouth every 4 (four) hours as needed. For cough   Yes [provider]  ibuprofen (ADVIL,MOTRIN) 200 MG tablet Take 400 mg by mouth every 6 (six) hours as needed. For pain   Yes [provider]  methadone (DOLOPHINE) 10 MG tablet Take 10 mg by mouth daily.   Yes [provider]  simvastatin (ZOCOR) 20 MG tablet Take 40 mg by mouth every  evening.   Yes [provider]    Family History No family history on file.  Social History Social History  Substance Use Topics  . Smoking status: Former Games developermoker  . Smokeless tobacco: Never Used  . Alcohol use Yes     Allergies   Patient has no known allergies.   Review of Systems Review of Systems  Constitutional: Positive for chills. Negative for fever.  Respiratory: Positive for cough, shortness of breath and wheezing.   Cardiovascular: Negative for chest pain and palpitations.  Gastrointestinal: Negative for abdominal pain, diarrhea, nausea and vomiting.  Neurological: Negative for headaches.  All other systems reviewed and are negative.    Physical Exam Updated Vital Signs BP (!) 190/129 (BP Location: Right Arm)   Pulse (!) 103   Temp 97 F (36.1 C) (Oral)   Resp (!) 30   Ht 5\' 11"  (1.803 m)   Wt 104.3 kg   SpO2 100%   BMI 32.08 kg/m   Physical Exam  Constitutional: He appears well-developed and well-nourished. He appears ill. He appears distressed.  HENT:  Head: Normocephalic and atraumatic.  Mouth/Throat: Mucous membranes are dry.  Eyes: Conjunctivae are normal.  Neck: Neck supple.  Cardiovascular: Regular rhythm.  Tachycardia present.   No murmur heard. Pulmonary/Chest: Accessory muscle usage present. Tachypnea noted. He is in respiratory distress. He has wheezes.  Abdominal: Soft. There is no tenderness.  Musculoskeletal: He exhibits no edema.       Right lower leg: He exhibits no swelling and no edema.       Left lower leg: He exhibits no swelling and no edema.  Neurological: He is alert.  Skin: Skin is warm and dry.  Psychiatric: He has a normal mood and affect.  Nursing note and vitals reviewed.  ED Treatments / Results  Labs (all labs ordered are listed, but only abnormal results are displayed) Labs Reviewed  COMPREHENSIVE METABOLIC PANEL - Abnormal; Notable for the following:       Result Value   Sodium 128 (*)    Chloride 96  (*)    CO2 21 (*)    Glucose, Bld 108 (*)    All other components within normal limits  CBC WITH DIFFERENTIAL/PLATELET - Abnormal; Notable for the following:    WBC 16.7 (*)    Neutro Abs 9.8 (*)    Lymphs Abs 4.5 (*)    Monocytes Absolute 1.2 (*)    Eosinophils Absolute 1.0 (*)    Basophils Absolute 0.2 (*)    All other components within normal limits  TROPONIN I - Abnormal; Notable for the following:    Troponin I 0.04 (*)    All other components within normal limits  I-STAT VENOUS BLOOD GAS, ED - Abnormal; Notable for the following:    pO2, Ven 46.0 (*)    Acid-base deficit 4.0 (*)    All other components within normal limits  BRAIN NATRIURETIC PEPTIDE  I-STAT VENOUS BLOOD GAS, ED    EKG  EKG Interpretation None       Radiology Dg Chest Port 1 View  Result Date: 07/21/2016 CLINICAL DATA:  Shortness of breath, difficulty breathing for several weeks EXAM: PORTABLE CHEST 1 VIEW COMPARISON:  None. FINDINGS: Mild hyperinflation. Heart is normal size. No confluent opacities or effusions. No acute bony abnormality. IMPRESSION: Mild hyperinflation.  No active disease. Electronically Signed   By: Charlett Nose M.D.   On: 07/21/2016 11:46    Procedures Procedures (including critical care time)  Medications Ordered in ED Medications  albuterol (PROVENTIL,VENTOLIN) solution continuous neb (10 mg/hr Nebulization New Bag/Given 07/21/16 1118)  ipratropium (ATROVENT) nebulizer solution 0.5 mg (0.5 mg Nebulization Given 07/21/16 1118)  aspirin chewable tablet 324 mg (324 mg Oral Given 07/21/16 1231)     Initial Impression / Assessment and Plan / ED Course  I have reviewed the triage vital signs and the nursing notes.  Pertinent labs & imaging results that were available during my care of the patient were reviewed by me and considered in my medical decision making (see chart for details).  Clinical Course as of Jul 22 1635  Mon Jul 21, 2016  1321 O2 Saturation: 74.0 [SO]    Clinical  Course User Index [SO] Lindalou Hose, MD    Patient reports in the last 1 week he's had increasing shortness of breath. Consistent with a COPD. His been using bronchodilators at home with some improvement in symptoms. Today had a acute worsening, so presented here. He versus some subjective chills, no fevers. He versus cough, worsening. No chest pain. Due to worsening shortness of breath and respiratory distress, called EMS. On arrival here the patient is in acute distress. Her EMS, and route they gave the patient bronchodilators, IV magnesium 2 mg, and Solu-Medrol IV. On my evaluation, the patient is wheezing diffusely, diminished breath sounds bilaterally. Placed the patient on BiPAP. Initial blood  gas showed mild acidemia at 7.25. PCO2 in the 50s. Hyponatremia to 128. Mild acidosis with bicarbonate of 20. Leukocytosis of 17. Chest x-ray with no obvious consolidation. We'll give the patient prophylactic for quinolones for COPD exacerbation. Troponin mildly elevated. He denies any chest pain. EKG without any obvious ischemic changes. Doubt ACS. Likely demand in nature. He has no swelling in his bilateral lower extremities and no evidence of DVT. Doubt pulmonary embolus.  On reevaluation, the patient had improvement from a respiratory standpoint. Able to take him off of BiPAP. Currently satting in the upper 80s on room air. Placed on 2 L nasal cannula. Continues to have some mild sensory muscle use. Would benefit from continued bronchodilator therapy in the hospital. [facility] admit him to the stepdown unit.  Final Clinical Impressions(s) / ED Diagnoses   Final diagnoses:  COPD exacerbation [facility])    New Prescriptions New Prescriptions   No medications on file     Lindalou Hose, MD 07/21/16 1637    Gerhard Munch, MD 07/23/16 [phone]

## 2016-07-22 ENCOUNTER — Encounter (HOSPITAL_COMMUNITY): Payer: Self-pay

## 2016-07-22 LAB — BASIC METABOLIC PANEL
Anion gap: 11 (ref 5–15)
BUN: 31 mg/dL — ABNORMAL HIGH (ref 6–20)
CHLORIDE: 94 mmol/L — AB (ref 101–111)
CO2: 23 mmol/L (ref 22–32)
CREATININE: 1.43 mg/dL — AB (ref 0.61–1.24)
Calcium: 9.4 mg/dL (ref 8.9–10.3)
GFR calc non Af Amer: 51 mL/min — ABNORMAL LOW (ref >60)
GFR, EST AFRICAN AMERICAN: 59 mL/min — AB (ref >60)
Glucose, Bld: 184 mg/dL — ABNORMAL HIGH (ref 65–99)
POTASSIUM: 4.5 mmol/L (ref 3.5–5.1)
Sodium: 128 mmol/L — ABNORMAL LOW (ref 135–145)

## 2016-07-22 LAB — CBC
HEMATOCRIT: 39.2 % (ref 39.0–52.0)
HEMOGLOBIN: 13.1 g/dL (ref 13.0–17.0)
MCH: 29.8 pg (ref 26.0–34.0)
MCHC: 33.4 g/dL (ref 30.0–36.0)
MCV: 89.1 fL (ref 78.0–100.0)
PLATELETS: 205 10*3/uL (ref 150–400)
RBC: 4.4 MIL/uL (ref 4.22–5.81)
RDW: 14.1 % (ref 11.5–15.5)
WBC: 12.1 10*3/uL — ABNORMAL HIGH (ref 4.0–10.5)

## 2016-07-22 LAB — RAPID URINE DRUG SCREEN, HOSP PERFORMED
AMPHETAMINES: NOT DETECTED
BARBITURATES: NOT DETECTED
Benzodiazepines: NOT DETECTED
Cocaine: NOT DETECTED
Opiates: POSITIVE — AB
TETRAHYDROCANNABINOL: NOT DETECTED

## 2016-07-22 LAB — HIV ANTIBODY (ROUTINE TESTING W REFLEX): HIV Screen 4th Generation wRfx: NONREACTIVE

## 2016-07-22 LAB — TROPONIN I: Troponin I: <0.03 ng/mL (ref <0.03)

## 2016-07-22 MED ORDER — TIOTROPIUM BROMIDE MONOHYDRATE 18 MCG IN CAPS
18.0000 ug | ORAL_CAPSULE | Freq: Every day | RESPIRATORY_TRACT | Status: DC
  Administered 2016-07-23 – 2016-07-24 (×2): 18 ug via RESPIRATORY_TRACT
  Filled 2016-07-22 (×2): qty 5

## 2016-07-22 MED ORDER — POLYETHYLENE GLYCOL 3350 17 G PO PACK
17.0000 g | PACK | Freq: Two times a day (BID) | ORAL | Status: DC | PRN
  Administered 2016-07-22: 17 g via ORAL
  Filled 2016-07-22: qty 1

## 2016-07-22 MED ORDER — GUAIFENESIN-DM 100-10 MG/5ML PO SYRP
10.0000 mL | ORAL_SOLUTION | ORAL | Status: DC | PRN
  Administered 2016-07-22: 10 mL via ORAL
  Filled 2016-07-22: qty 10

## 2016-07-22 NOTE — Progress Notes (Signed)
FPTS Interim Progress Note  Patient examined at bedside at 10PM this evening.  Sleeping comfortably so did not wake him.  Stable on 4L O2 per [facility].  VSS and good O2 sats of 90-92%.  Discussed with RN taking care of him and respiratory status appears improved with duonebs.  Last breathing treatment was ~7PM this evening.  Per RN he has not been endorsing SI.  Will continue to monitor overnight.    Austin MarchAmin, Austin Sumners, MD 07/22/2016, 10:11 PM PGY-1, [facility] pager [phone]

## 2016-07-22 NOTE — Care Management Note (Signed)
Case Management Note  Patient Details  Name: Elisha PonderDaniel L Langhorne MRN: [id number] Date of Birth: 04/12/1950  Subjective/Objective:                    Action/Plan:  Patient[facility] PCP [facility] [phone] . If patient needs home oxygen Dr Gerlene FeePiva will have to approve. Called left message regarding same with Dr Foye SpurlingPiva's office awaiting call back.   Will need oxygen saturation note . Expected Discharge Date:                  Expected Discharge Plan:  Home/Self Care  In-House Referral:     Discharge planning Services     Post Acute Care Choice:    Choice offered to:     DME Arranged:    DME Agency:     HH Arranged:    HH Agency:     Status of Service:  In process, will continue to follow  If discussed at Long Length of Stay Meetings, dates discussed:    Additional Comments:  Kingsley PlanWile, Kanen Mottola Marie, RN 07/22/2016, 10:59 AM

## 2016-07-22 NOTE — Progress Notes (Signed)
Paged fam med intern due to patient requesting us to put him on more oxygen, I bumped him up from 3L to 4L because he was sitting at 87-88% oxygen level and wheezing worse than this morning, she said she would come up and look at him. I also asked the the intern per patient request for a laxative, she said she would order one.

## 2016-07-22 NOTE — Progress Notes (Signed)
FPTS Interim Progress Note  S:Paged by nursing, patient requiring more oxygen and requiring frequent duonebs. Patient seen and examined at bedside. He was very anxious when I entered the room. He states he took 4 of his home guaifenesin to help clear up his secretions which he brought with him. States dyspnea has improved with duonebs and with increased oxygen to 4L.  O: BP (!) 131/92 (BP Location: Left Arm)   Pulse (!) 110   Temp 98.2 F (36.8 C) (Oral)   Resp 18   Ht 5\' 11"  (1.803 m)   Wt 230 lb (104.3 kg)   SpO2 90%   BMI 32.08 kg/m   GEN: Anxious-appearing male sits at edge of bed, non-toxic appearing CARD: RRR, no m/r/g PULM: Diffuse wheezes throughout all lung fields, moves air throughout all lung fields, speaks in full sentences, no increased work of breathing PSYCH: anxious affect, AAOx3, thought process linear  A/P: Dyspnea/O2 requirement - Continue Duonebs q4H PRN - albuterol q2H PRN - titrate O2 for 88-92% - monitor respiratory status closely - will ask night intern to check on him later - encouraged patient to please let us dispense medications, refrain from taking pills he brought  From home - for anxiety, continue to watch CIWA scores (2, 4 most recently)  Howard PouchFeng, Nathaniel Wakeley, MD 07/22/2016, 7:10 PM PGY-1, [facility] pager [phone]

## 2016-07-22 NOTE — Progress Notes (Signed)
Family Medicine Teaching Service Daily Progress Note Intern Pager: [phone]  Patient name: Austin Maynard Chat[place] Medical record number: [id number] Date of birth: 11/12/1950 Age: 66 y.o. Gender: male  [facility] Provider[facility] Consultants:  Code Status: DNR/DNI  Pt Overview and Major Events to Date:  Mr Austin Maynard is a 66 yo with COPD and recent PNA admitted for worsening dyspnea likely due to COPD exacerbation. On Bipap in ED then switched to nasal cannula 2L, turned up to 3L, Satting 90%. On levaquin, Duonebs, Prednisone with prn albuterol. Slowly improving physical exam.   Assessment and Plan: Austin Maynard Yeo is a 66 y.o. male admitted for COPD exacerbation. PMH is significant for COPD, recent PNA, DJD, HTN, MI (2000, stent) and Depression with SI.  COPD exacerbation: Mildly improving wheezes in all lung fields. Still satting in high 80s on 3L by nasal cannula. 5/7 EMS gave Albuterol 10mg , Atrovent 1mg , Solumedrol 125mg  and Mag 2mg . Was satting 88% on room air so started on Bi-Pap then weaned to nasal cannula on 2L and satting at 96%. Levaquin started in ED. Resolving leukocytosis 12.1<16.7. Afebrile. CXR without focal infiltration. No signs of fluid overload, BNP 89. Troponins trended and <0.03 consistently. EKGs trended and normal.  - DuoNebs q4h - PRN Albuterol q2h - Continue Levaquin for total of 5 days (5/7-) - Prednisone 50 mg starting 5/8. Received Solu-Medrol 5/7 - stop cycling troponin and EKG. - Urine Legionella - PFT as outpatient - Will start LAMA/LABA once breathing is okay  Hyponatremia: Na 128. Appears euvolemic.  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 Seen at pain clinic for opiod management, but [facility] [place]. Takes methadone, gabapentin and ibuprofen. UDS positive for opiods. - Continue [facility] dose while inpatient - Continue home Gabapentin 300mg   HTN: History  of HTN. Unclear if on medications at home. Hypertensive (then hypotensive) upon arrival to ED. Currently normotensive.  - Monitor and consider adding agent if needed.   CAD s/p stent:  in 2000. Denies chest pain. Dyspnea likely due to COPD exacerbation. Lipid panel LDL 129, Triglycerides 193, cholesterol 217, at 1/2 average risk.  - Continue home simvastatin  Depression: Says wanted to kill himself instead of calling EMS when he felt dyspneic. Denies ongoing SI now. Has loaded gun in home. On Fluoxetine at home.  - Continue home Fluoxetine  Alcohol abuse: Drinks 24oz of beer daily. Denies ever experiencing withdrawal symptoms.  - CIWA protocol until tonight, then stop if continues not to be scaling.   FEN/GI: Regular diet.  Prophylaxis: SCDs.   Disposition: Continue admission to improve breathing.   Subjective:  Sitting up in bed eating breakfast with nasal cannula hanging on ear and satting at 87%. Minor improvement to 90% when replaced (3L). Asking about more steroids and communicating anxiety about being sent home without adequate treatment.   Objective: Temp:  [97 F (36.1 C)-98.2 F (36.8 C)] 98.1 F (36.7 C) (05/08 0605) Pulse Rate:  [85-103] 85 (05/08 0605) Resp:  [14-30] 19 (05/08 0605) BP: (107-190)/(65-129) 127/72 (05/08 0605) SpO2:  [92 %-100 %] 95 % (05/08 0809) FiO2 (%):  [30 %] 30 % (05/07 1119) Weight:  [230 lb (104.3 kg)] 230 lb (104.3 kg) (05/07 1055) Physical Exam: General: Ill appearing man, coughing and breathing heavily between words Cardiovascular: RRR no murmurs Respiratory: Rhoncorous and wheezing throughout but improved from yesterday.  Abdomen: rigid, but non tender. Extremities: no edema.   Laboratory:  Recent Labs Lab 07/21/16 1030 07/21/16 1651 07/22/16 0503  WBC 16.7* 9.8 12.1*  HGB 16.1 15.0 13.1  HCT 47.1 44.0 39.2  PLT 211 221 205    Recent Labs Lab 07/21/16 1030 07/21/16 1651 07/22/16 0503  NA 128*  --  128*  K 4.9  --   4.5  CL 96*  --  94*  CO2 21*  --  23  BUN 14  --  31*  CREATININE 1.09 1.40* 1.43*  CALCIUM 9.7  --  9.4  PROT 7.8  --   --   BILITOT 1.1  --   --   ALKPHOS 57  --   --   ALT 29  --   --   AST 33  --   --   GLUCOSE 108*  --  184*    Imaging/Diagnostic Tests: EKG normal   Sandria Manly, Medical Student 07/22/2016, 9:39 AM MS4, [place] [facility] FPTS Intern pager: [phone], text pages welcome  ________________________________________________________________________ I agree with the medical student's note above, with notable exceptions in the assessment and plan which I have given below.   Austin Maynard is a 66 y.o., here for COPD exacerbation. PMH is significant for COPD, recent PNA, DJD, HTN, MI (2000, stent) and Depression with SI.  Physical Exam Gen: Lying in bed, NAD  Pulm: Wheezing throughout in all lung field, No increased WOB, 3 Liters in place  CV: RRR, no murmurs, no gallops Extremities: No lower extremity edema   Plan:  Austin Maynard is a 66 y.o. male admitted for COPD exacerbation. PMH is significant for COPD, recent PNA, DJD, HTN, MI (2000, stent) and Depression with SI.  COPD exacerbation: Wheezes and Rhonchi noted in all lung fields. Currently on 3L by nasal cannula. Continues to have mild leukocytosis, possibly due steroids CXR without focal infiltration. No signs of fluid overload, BNP 89. Troponins trended and <0.03 consistently. EKGs trended and normal.  - DuoNebs q4h - PRN Albuterol q2h - Continue Levaquin for total of 5 days (5/7-) - Prednisone 50 mg starting 5/8.  - Urine Legionella pending  - PFT as outpatient - Will start Spiriva   Hyponatremia: Na 128. Appears euvolemic.  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 Seen at pain clinic for opiod management, but currently receiving methadone 10mg /day from [facility] in [place]. Takes methadone, gabapentin and ibuprofen. UDS positive for  opiods. - Continue [facility] dose while inpatient - Continue home Gabapentin 300mg   HTN: History of HTN. Currently normotensive.  - Monitor and consider adding agent if needed.   CAD s/p stent in 2000:. Denies chest pain. Dyspnea likely due to COPD exacerbation. Lipid panel LDL 129, Triglycerides 193, cholesterol 217, at 1/2 average risk.  - Continue home simvastatin  Depression: Says wanted to kill himself instead of calling EMS when he felt dyspneic. Denies ongoing SI now. Has loaded gun in home. On Fluoxetine at home.  - Continue home Fluoxetine  Alcohol abuse: Drinks 24oz of beer daily. Denies ever experiencing withdrawal symptoms.  - CIWA protocol until tonight, then stop if continues not to be scaling.   FEN/GI: Regular diet.  Prophylaxis:Lovenox   Noralee Chars, MD Family Medicine, PGY 2 07/22/2016 2:16 PM

## 2016-07-22 NOTE — Progress Notes (Signed)
Inpatient Diabetes Program Recommendations  AACE/ADA: New Consensus Statement on Inpatient Glycemic Control (2015)  Target Ranges:  Prepandial:   less than 140 mg/dL      Peak postprandial:   less than 180 mg/dL (1-2 hours)      Critically ill patients:  140 - 180 mg/dL   Results for Austin Maynard, Austin Maynard (MRN [id number]) as of 07/22/2016 07:37  Ref. Range 07/21/2016 10:30 07/22/2016 05:03  Glucose Latest Ref Range: 65 - 99 mg/dL 409108 (H) 811184 (H)    Review of Glycemic Control  Diabetes history: No Outpatient Diabetes medications: NA Current orders for Inpatient glycemic control: None  Inpatient Diabetes Program Recommendations: Correction (SSI): Noted lab glucose of 184 mg/dl. May want to consider ordering CBGs with Novolog sensitive correction if needed. HgbA1C: Please consider ordering an A1C to evaluate glycemic control over the past 2-3 months.  Thanks, Orlando PennerMarie Li Fragoso, RN, MSN, CDE Diabetes Coordinator Inpatient Diabetes Program [phone] (Team Pager from 8am to 5pm)

## 2016-07-22 NOTE — Progress Notes (Signed)
Nutrition Brief Note  Patient identified on the Malnutrition Screening Tool (MST) Report  Wt Readings from Last 15 Encounters:  07/21/16 230 lb (104.3 kg)   Austin Maynard is a 66 y.o. male presenting with 2 weeks of worsening dyspnea. PMH is significant for COPD, recent PNA, DJD, HTN, MI (2000, stent) and Depression with SI.  Pt sleeping soundly at time of visit. Did not arouse for interview or during exam.   Nutrition-Focused physical exam completed. Findings are no fat depletion, no muscle depletion, and no edema.   Body mass index is 32.08 kg/m. Patient meets criteria for obesity, class I based on current BMI.   Current diet order is regular, patient is consuming approximately 100% of meals at this time. Labs and medications reviewed.   No nutrition interventions warranted at this time. If nutrition issues arise, please consult RD.   Neville Pauls A. Mayford KnifeWilliams, RD, LDN, CDE Pager: [phone] After hours Pager: [phone]

## 2016-07-23 DIAGNOSIS — E871 Hypo-osmolality and hyponatremia: Secondary | ICD-10-CM

## 2016-07-23 DIAGNOSIS — F1010 Alcohol abuse, uncomplicated: Secondary | ICD-10-CM

## 2016-07-23 DIAGNOSIS — F1120 Opioid dependence, uncomplicated: Secondary | ICD-10-CM

## 2016-07-23 DIAGNOSIS — I10 Essential (primary) hypertension: Secondary | ICD-10-CM

## 2016-07-23 DIAGNOSIS — G894 Chronic pain syndrome: Secondary | ICD-10-CM

## 2016-07-23 LAB — BASIC METABOLIC PANEL
ANION GAP: 9 (ref 5–15)
BUN: 23 mg/dL — ABNORMAL HIGH (ref 6–20)
CALCIUM: 9.3 mg/dL (ref 8.9–10.3)
CO2: 26 mmol/L (ref 22–32)
CREATININE: 0.98 mg/dL (ref 0.61–1.24)
Chloride: 97 mmol/L — ABNORMAL LOW (ref 101–111)
Glucose, Bld: 107 mg/dL — ABNORMAL HIGH (ref 65–99)
Potassium: 4.8 mmol/L (ref 3.5–5.1)
SODIUM: 132 mmol/L — AB (ref 135–145)

## 2016-07-23 LAB — LEGIONELLA PNEUMOPHILA SEROGP 1 UR AG: L. pneumophila Serogp 1 Ur Ag: NEGATIVE

## 2016-07-23 LAB — CBC
HCT: 41.5 % (ref 39.0–52.0)
Hemoglobin: 13.4 g/dL (ref 13.0–17.0)
MCH: 29.4 pg (ref 26.0–34.0)
MCHC: 32.3 g/dL (ref 30.0–36.0)
MCV: 91 fL (ref 78.0–100.0)
PLATELETS: 206 10*3/uL (ref 150–400)
RBC: 4.56 MIL/uL (ref 4.22–5.81)
RDW: 14.5 % (ref 11.5–15.5)
WBC: 16.5 10*3/uL — AB (ref 4.0–10.5)

## 2016-07-23 MED ORDER — ALBUTEROL SULFATE (2.5 MG/3ML) 0.083% IN NEBU
2.5000 mg | INHALATION_SOLUTION | Freq: Four times a day (QID) | RESPIRATORY_TRACT | Status: DC
  Administered 2016-07-23 – 2016-07-24 (×2): 2.5 mg via RESPIRATORY_TRACT
  Filled 2016-07-23 (×2): qty 3

## 2016-07-23 MED ORDER — IPRATROPIUM-ALBUTEROL 0.5-2.5 (3) MG/3ML IN SOLN
3.0000 mL | RESPIRATORY_TRACT | Status: DC | PRN
  Administered 2016-07-24: 3 mL via RESPIRATORY_TRACT
  Filled 2016-07-23: qty 3

## 2016-07-23 MED ORDER — ATORVASTATIN CALCIUM 80 MG PO TABS
80.0000 mg | ORAL_TABLET | Freq: Every day | ORAL | Status: DC
  Administered 2016-07-23: 80 mg via ORAL
  Filled 2016-07-23: qty 1

## 2016-07-23 MED ORDER — MOMETASONE FURO-FORMOTEROL FUM 200-5 MCG/ACT IN AERO
2.0000 | INHALATION_SPRAY | Freq: Two times a day (BID) | RESPIRATORY_TRACT | Status: DC
  Administered 2016-07-23 – 2016-07-24 (×2): 2 via RESPIRATORY_TRACT
  Filled 2016-07-23: qty 8.8

## 2016-07-23 MED ORDER — ALBUTEROL SULFATE (2.5 MG/3ML) 0.083% IN NEBU: 2.5000 mg | INHALATION_SOLUTION | Freq: Four times a day (QID) | RESPIRATORY_TRACT | Status: DC

## 2016-07-23 NOTE — Care Management (Signed)
SATURATION QUALIFICATIONS: (This note is used to comply with regulatory documentation for home oxygen)  Patient Saturations on Room Air at Rest = 88%  Patient Saturations on Room Air while Ambulating =85%  Patient Saturations on 4 Liters of oxygen while Ambulating = 91%  Please briefly explain why patient needs home oxygen:pt desats  When on RA and ambulating

## 2016-07-23 NOTE — Progress Notes (Signed)
RT did a protocol assessment on patient due to MD canceling scheduled treatments. Rescheduled Alubterol QID as patient has expiratory wheezes throughout all lung fields and sats are low 90's on 3 Lpm nasal cannula. Patient also states that he takes treatments at least six times in a 24 hour periord.  RN aware.

## 2016-07-23 NOTE — Progress Notes (Signed)
Patient called for PRN breathing treatment due to MD canceling scheduled treatments again. Patient diminished in LUL and expiratory wheezes throughout rest of lung fields. Patient does take these treatments at home and they should be scheduled so patient doesn't have to call out for treatment as he will every 3-4 hours. Rescheduling treatments. RN aware.

## 2016-07-23 NOTE — Care Management Note (Signed)
Case Management Note  Patient Details  Name: Austin PonderDaniel L Maynard MRN: [id number] Date of Birth: 01/12/1951  Subjective/Objective:                    Action/Plan:  See previous note . [facility] [place] [facility]. Patient states he has family who will transport him home at discharge.    Expected Discharge Date:                  Expected Discharge Plan:  Home/Self Care  In-House Referral:     Discharge planning Services  CM Consult  Post Acute Care Choice:  Durable Medical Equipment Choice offered to:  Patient  DME Arranged:  Oxygen DME Agency:  Other - Comment  HH Arranged:    HH Agency:     Status of Service:  In process, will continue to follow  If discussed at Long Length of Stay Meetings, dates discussed:    Additional Comments:  Kingsley PlanWile, Tianni Escamilla Marie, RN 07/23/2016, 2:30 PM

## 2016-07-23 NOTE — Clinical Social Work Note (Signed)
Clinical Social Work Assessment  Patient Details  Name: Austin Maynard MRN: [id number] Date of Birth: [birth year]  Date of referral:  07/23/16               Reason for consult:  Suicide Risk/Attempt, Substance Use/ETOH Abuse                Permission sought to share information with:  Family Supports Permission granted to share information::  No  Name::        Agency::     Relationship::     Contact Information:     Housing/Transportation Living arrangements for the past 2 months:  Single Family Home Source of Information:  Patient Patient Interpreter Needed:  None Criminal Activity/Legal Involvement Pertinent to Current Situation/Hospitalization:  No - Comment as needed Significant Relationships:  Adult Children Lives with:  Self Do you feel safe going back to the place where you live?  Yes Need for family participation in patient care:  No (Coment)  Care giving concerns:  No care giving concerns identified.    Social Worker assessment / plan:  CSW met with pt at bedside to address consults for "Crisis counseling-no support system; probably need help at home" and "Crisis counseling-Patient with recent hx of SI as well as alcoholol abuse - resources please." CSW introduced self and explained CSW responsibilities. Pt from home alone with somewhat estranged relationships with brothers and sister. Pt complains that one brother lives within a mile from pt, but never comes to visit or calls due to a disagreement about brother's religious preference. Pt states other brother lives close, but is very busy and sister is "a lot to handle." Pt states he and his son have a "pretty good" relationship. Son does come to visit pt at home and has visited pt in the hospital, but is busy starting a business also. Pt has a neighbor that cooks big meals and gives pt a plate and checks on him everyday.  Pt states he was on a methadone regimen for about 20 years for pain from previous surgeries, before  being taken off when going to a different pain clinic. Pt states he drinks 1-2 beers daily to help ease his back pain. Pt states he has been to other MDs and another pain clinic, but has not found relief. Pt states he has not been able to do much of anything due to his back pain and mostly sleeps. Pt admits that he has thought about killing himself because the pain has been so intense, but understands that "there's no coming back" from killing yourself. Pt states "it's just a thought" and does not believe he would ever do it. Pt does not have a plan and is not currently expressing SI.  CSW actively listened to client and provided brief solution focused counseling. CSW and pt talked at length about coping strategies, family dynamics, dependence on methadone/beer, and assistance needed in the home. Pt believes he can stop drinking at any time. Pt refused SA resources and MH support group/therapy information, but stated he is aware of how to access those resources through tthe [place], if ever needed. 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 CSW  provided resources for In Home Services to assist with light housekeeping, light cooking, medication management, and assistance with ADLS through the [place] and [place]. Pt pleased with information provided and had no other questions. CSW signing off as no other CSW needs  identified. Please reconsult if new CSW needs arise.   Employment status:  Disabled (Comment on whether or not currently receiving Disability) Insurance information:  Medicare PT Recommendations:  Not assessed at this time Information / Referral to community resources:  Other (Comment Required) (Pt refused substance use/MH resources)  Patient/Family's Response to care:  Pt was appreciative of CSW support and guidance.   Patient/Family's Understanding of and Emotional Response to Diagnosis, Current Treatment, and Prognosis:  Pt appears to  understand diagnosis, prognosis, and current treatment. Pt hopeful that he will be able to receive In-Home services and work with MD to manage his pain.   Emotional Assessment Appearance:  Appears stated age Attitude/Demeanor/Rapport:  Other (Appropriate) Affect (typically observed):  Accepting, Adaptable Orientation:  Oriented to Self, Oriented to Place, Oriented to Situation, Oriented to  Time Alcohol / Substance use:  Alcohol Use Psych involvement (Current and /or in the community):  No (Comment)  Discharge Needs  Concerns to be addressed:  Coping/Stress Concerns, Substance Abuse Concerns, Mental Health Concerns, Adjustment to Illness, Medication Concerns, Care Coordination Readmission within the last 30 days:  No Current discharge risk:  Chronically ill, Substance Abuse, Lives alone Barriers to Discharge:  Continued Medical Work up, Active Substance Use   Truitt Merle, [place] 07/23/2016, 12:17 PM

## 2016-07-23 NOTE — Progress Notes (Signed)
Family Medicine Teaching Service Daily Progress Note Intern Pager: [phone]  Patient name: Austin Maynard Medical record number: [id number] Date of birth: 1950-06-11 Age: 66 y.o. Gender: male  Primary Care Provider: [facility] Consultants:  Code Status: DNR/DNI  Pt Overview and Major Events to Date:  Austin Maynard is a 66 yo with COPD and recent PNA admitted for worsening dyspnea likely due to COPD exacerbation. On Bipap in ED then switched to nasal cannula 2L, turned up to 3L, Satting 90%. On levaquin, Duonebs, Prednisone with prn albuterol. Slowly improving physical exam. Turned up to 4L overnight due to desats to 85%, weaned to 1L this AM holding sats at 91%.  Assessment and Plan: Austin Swander Pumphreyis a 65 y.o.maleadmitted for COPD exacerbation. PMH is significant for COPD, recent PNA, DJD, HTN, MI (2000, stent) and Depression with SI.  COPD exacerbation: Mildly improving wheezes in all lung fields, still rhoncorous on R>L. Up to 4L overnight, weaned to 1L satting at 91% this am. 5/7 EMS gave Albuterol 10mg , Atrovent 1mg , Solumedrol 125mg  and Mag 2mg . Levaquin started in ED. Leukocytosis 16.5, similar to on admission. Afebrile. CXR without focal infiltration. No signs of fluid overload, BNP 89. Troponins trended and <0.03 consistently. EKGs trended and normal.  - DuoNebs q4h - PRN Albuterol q2h - Continue Levaquin for total of 5 days (5/7-) - Prednisone 50 mg starting 5/8. Received Solu-Medrol 5/7 - Spiriva QD - Dulera added today - Urine Legionella - PFT as outpatient  Hyponatremia: Na 128. Appears euvolemic.  - Urine Legionella pending  DJD: Back pain with history of back and neck surgery. Seen at pain clinic for opiod management, but currently receiving methadone 10mg /day from [facility] hospital in [place]. Takes methadone, gabapentin and ibuprofen. UDS positive for opiods. - Continue VA dose while inpatient - Continue home Gabapentin 300mg   HTN: History  of HTN. Unclear if on medications at home. Hypertensive (then hypotensive) upon arrival to ED. Currently normotensive.  - Monitor and consider adding agent if needed.   CAD s/p stent:in 2000. Denies chest pain. Dyspnea likely due to COPD exacerbation. Lipid panel LDL 129, Triglycerides 193, cholesterol 217, at 1/2 average risk.  - Continue home simvastatin  Depression/Anxiety: Says wantedto kill himself instead of calling EMS when he felt dyspneic. Denies ongoing SI now. Has loaded gun in home. On Fluoxetine at home.  - Continue home Fluoxetine - Case Management consulted - On buproprion at home, not continued here.   Alcohol abuse: Drinks 24oz of beer daily. Denies ever experiencing withdrawal symptoms.  - CIWA protocol  FEN/GI:Regular diet.  Prophylaxis:SCDs.    Disposition: Continue admission, discharge tomorrow likely  Subjective:  Breathing easier today and tolerating weaning to 1L by nasal cannula.  Objective: Temp:  [98 F (36.7 C)-98.3 F (36.8 C)] 98 F (36.7 C) (05/09 0500) Pulse Rate:  [80-110] 80 (05/09 0500) Resp:  [18] 18 (05/09 0500) BP: (121-153)/(84-92) 153/84 (05/09 0500) SpO2:  [90 %-96 %] 94 % (05/09 0900) Physical Exam: General: Sitting up in bed, eating breakfast, speaking without heavy breathing or coughing Cardiovascular: RRR no murmurs Respiratory: Rhoncorous R>L and soft wheezing throughout but improved from yesterday.  Abdomen: soft, nontender.  Extremities: no edema.  Laboratory:  Recent Labs Lab 07/21/16 1651 07/22/16 0503 07/23/16 0341  WBC 9.8 12.1* 16.5*  HGB 15.0 13.1 13.4  HCT 44.0 39.2 41.5  PLT 221 205 206    Recent Labs Lab 07/21/16 1030 07/21/16 1651 07/22/16 0503 07/23/16 0341  NA 128*  --  128* 132*  K 4.9  --  4.5 4.8  CL 96*  --  94* 97*  CO2 21*  --  23 26  BUN 14  --  31* 23*  CREATININE 1.09 1.40* 1.43* 0.98  CALCIUM 9.7  --  9.4 9.3  PROT 7.8  --   --   --   BILITOT 1.1  --   --   --   ALKPHOS 57   --   --   --   ALT 29  --   --   --   AST 33  --   --   --   GLUCOSE 108*  --  184* 107*     Devasthali, Oneita Krasakhee S, Medical Student 07/23/2016, 9:44 AM MS4, [facility] FPTS Intern pager: [phone], text pages welcome  ________________________________________________________________________ I agree with the medical student's note above, with notable exceptions in the assessment and plan which I have given below.   Austin Maynard is a 66 y.o., here for COPD exacerbation. PMH is significant for COPD, recent PNA, DJD, HTN, MI (2000, stent) and Depression with SI.  Physical Exam Gen: Lying in bed, NAD  Pulm: Wheezing throughout in all lung field, No increased WOB, 3 Liters in place  CV: RRR, no murmurs, no gallops Extremities: No lower extremity edema   Plan:  Austin BruntDaniel L Pumphreyis a 65 y.o.maleadmitted for COPD exacerbation. PMH is significant for COPD, recent PNA, DJD, HTN, MI (2000, stent) and Depression with SI.  COPD exacerbation: Improved currently on 1 L of oxygen.  Leukocytosis likely due to steroids.  - DuoNebs q4h prb - PRN Albuterol q2h - Continue Doxycycline 100 mg BID  - Continue Prednisone 50 mg starting 5/8.  - Urine Legionella pending  - PFT as outpatient - Oxygen walking test, DME for oxygen  - Continue Spirvia and Dulera   Hyponatremia: Na 132. Appears euvolemic.  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 Seen at pain clinic for opiod management, but [facility] [place]. Takes methadone, gabapentin and ibuprofen. UDS positive for opiods. - Continue VA dose while inpatient - Continue home Gabapentin 300mg   HTN: History of HTN. Currently normotensive.  - Monitor and consider adding agent if needed.   CAD s/p stent in 2000:. Denies chest pain. Dyspnea likely due to COPD exacerbation. Lipid panel LDL 129, Triglycerides 193, cholesterol 217, at 1/2 average  risk.  - Continue home simvastatin  Depression: Says wantedto kill himself instead of calling EMS when he felt dyspneic. Denies ongoing SI now. Has loaded gun in home. On Fluoxetine at home.  - Continue home Fluoxetine - Social work consulted   Alcohol abuse: Drinks 24oz of beer daily. Denies ever experiencing withdrawal symptoms.  - CIWA protocol 0.   FEN/GI:Regular diet.  Prophylaxis:Lovenox   Noralee CharsAsiyah Ladaisha Portillo, MD Family Medicine, PGY 2 07/23/2016 2:19 PM

## 2016-07-23 NOTE — Care Management (Addendum)
  1339 [facility] [place] [facility] [place] [facility] a [facility] . Selena BattenKim will have VA SW call NCM . Post discharge VA will assess patient for any home health needs at hospital follow up appointment. Awaiting MD sign home oxygen order. Spoke to MD.  Left message for VA SW Delila PereyraBertina Duncan ext [phone], awaiting call back. Ronny FlurryHeather Jeyson Deshotel RN BSN [phone]     Spoke with Selena BattenKim ( Dr Piva's nurse) [facility]. Discussed possible need for home oxygen . Faxed Karen oxygen saturation note and progress note. Fax [phone], phone [phone] ext [phone].  Ronny FlurryHeather Tanija Germani RN BSN [phone]

## 2016-07-24 LAB — BASIC METABOLIC PANEL
ANION GAP: 7 (ref 5–15)
BUN: 19 mg/dL (ref 6–20)
CALCIUM: 9 mg/dL (ref 8.9–10.3)
CO2: 25 mmol/L (ref 22–32)
Chloride: 97 mmol/L — ABNORMAL LOW (ref 101–111)
Creatinine, Ser: 0.9 mg/dL (ref 0.61–1.24)
Glucose, Bld: 101 mg/dL — ABNORMAL HIGH (ref 65–99)
POTASSIUM: 4.3 mmol/L (ref 3.5–5.1)
SODIUM: 129 mmol/L — AB (ref 135–145)

## 2016-07-24 LAB — CBC
HCT: 38.3 % — ABNORMAL LOW (ref 39.0–52.0)
Hemoglobin: 12.2 g/dL — ABNORMAL LOW (ref 13.0–17.0)
MCH: 29 pg (ref 26.0–34.0)
MCHC: 31.9 g/dL (ref 30.0–36.0)
MCV: 91 fL (ref 78.0–100.0)
PLATELETS: 206 10*3/uL (ref 150–400)
RBC: 4.21 MIL/uL — ABNORMAL LOW (ref 4.22–5.81)
RDW: 14.3 % (ref 11.5–15.5)
WBC: 14.6 10*3/uL — AB (ref 4.0–10.5)

## 2016-07-24 MED ORDER — PREDNISOLONE 5 MG PO TABS: 50.0000 mg | ORAL_TABLET | Freq: Every day | ORAL | 0 refills | Status: DC

## 2016-07-24 MED ORDER — TIOTROPIUM BROMIDE MONOHYDRATE 2.5 MCG/ACT IN AERS: 2.0000 | INHALATION_SPRAY | Freq: Every day | RESPIRATORY_TRACT | 1 refills | Status: DC

## 2016-07-24 MED ORDER — BUDESONIDE-FORMOTEROL FUMARATE 160-4.5 MCG/ACT IN AERO: 2.0000 | INHALATION_SPRAY | Freq: Two times a day (BID) | RESPIRATORY_TRACT | 1 refills | Status: AC

## 2016-07-24 MED ORDER — BUDESONIDE-FORMOTEROL FUMARATE 160-4.5 MCG/ACT IN AERO: 2.0000 | INHALATION_SPRAY | Freq: Two times a day (BID) | RESPIRATORY_TRACT | 1 refills | Status: DC

## 2016-07-24 MED ORDER — TIOTROPIUM BROMIDE MONOHYDRATE 18 MCG IN CAPS: 18.0000 ug | ORAL_CAPSULE | Freq: Every day | RESPIRATORY_TRACT | 1 refills | Status: AC

## 2016-07-24 MED ORDER — TIOTROPIUM BROMIDE MONOHYDRATE 18 MCG IN CAPS: 18.0000 ug | ORAL_CAPSULE | Freq: Every day | RESPIRATORY_TRACT | 1 refills | Status: DC

## 2016-07-24 MED ORDER — ALBUTEROL SULFATE (2.5 MG/3ML) 0.083% IN NEBU
2.5000 mg | INHALATION_SOLUTION | RESPIRATORY_TRACT | Status: DC | PRN
  Administered 2016-07-24: 2.5 mg via RESPIRATORY_TRACT
  Filled 2016-07-24: qty 3

## 2016-07-24 MED ORDER — DOXYCYCLINE HYCLATE 100 MG PO TABS: 100.0000 mg | ORAL_TABLET | Freq: Two times a day (BID) | ORAL | 0 refills | Status: DC

## 2016-07-24 MED ORDER — GUAIFENESIN 200 MG PO TABS: 600.0000 mg | ORAL_TABLET | ORAL | 0 refills | Status: DC | PRN

## 2016-07-24 NOTE — Discharge Summary (Signed)
Family Medicine Teaching [facility] Discharge Summary  Patient name: Austin Maynard Medical record number: [id number] Date of birth: May 29, 1950 Age: 66 y.o. Gender: male Date of Admission: 07/21/2016  Date of Discharge: 07/24/16  Admitting Physician: Nestor Ramp, MD  Primary Care Provider: [facility] Consultants: none  Indication for Hospitalization: COPD exacerbation  Discharge Diagnoses/Problem List:  COPD Depression/anxiety Alcohol abuse Chronic pain on methadone  Disposition: home  Discharge Condition: stable  Discharge Exam:  GEN: appears well, no apparent distress. Oropharynx: mmm without erythema or exudation CVS: RRR, nl s1 & s2, no murmurs, no edema RESP: speaks in full sentence, no IWOB, upper airway sounds bilaterally no wheezes or crackles GI: BS present & normal, soft, NTND MSK: no focal tenderness or notable swelling SKIN: no apparent skin lesion NEURO: alert and oiented appropriately, no gross defecits  PSYCH: Appears anxious. No SI or HI  Brief Hospital Course:  HPI: Austin Maynard is a 66yo man with a history of COPD and pneumonia one month ago (treated at [facility]) who presented to the ED via EMS with gradual worsening of dyspnea and cough for 2 weeks.   COPD exacerbation Received a DuoNeb, steroids and Mg en route to hospital by EMS. Upon arrival to the ED, he was satting to 87% and was briefly placed on Bi-pap, with improvement he was transitioned to 2L by nasal cannula satting 96%. He was admitted and treated with doxycycline, Q4h Duonebs, prn Albuterol and Prednisone with improvement in his breathing and cough. He was started on [place] on hospital day 2. Prior to discharge, he was ambulated for oxygen requirements and desated only to 89%. His resting oxygen saturation was 96% on room air. He was discharged on doxycycline 100mg  twice a day for two days, Prednisone 50 mg daily for two days and his home Spiriva  and Symbicort.   ACS was ruled out by serial troponin and EKG. CXR negative for pneumonia.   Off note, patient uses his albuterol and Combivent at home excessively. He was advised to use those medications only as needed.   Depression/Anxiety: we think this has some contribution to his COPD and excessive use of albuterol at home. Continued on home fluoxetine. Mentioned having had suicidal thoughts when he wasn't able to breath before calling 911. He denies SI or HI during this hospitalization. He reports having a loaded gun at home. Abuses alcohol and lives alone so very high suicide risk. We recommend close follow up with his psychiatrist or PCP.   Back pain and history of DJD Long history of surgeries and back pain treated with opioids. Currently treated with methadone 10mg  which [facility]. Methadone continued during admission. Home gabapentin continued during admission too. No new Rx given at discharge.   Issues for Follow Up:  1. COPD exacerbation: recommend PFT as an outpatient. Also recommend sleep study as an outpatient 2. Depression/anxiety: discharge on home Prozac. Recommend close follow-up as an outpatient. He is at increased risk for suicide due to the above reasons.  3. Other chronic issues: Stable  Significant Procedures:   Significant Labs and Imaging:   Recent Labs Lab 07/22/16 0503 07/23/16 0341 07/24/16 0350  WBC 12.1* 16.5* 14.6*  HGB 13.1 13.4 12.2*  HCT 39.2 41.5 38.3*  PLT 205 206 206    Recent Labs Lab 07/21/16 1030 07/21/16 1651 07/22/16 0503 07/23/16 0341 07/24/16 0350  NA 128*  --  128* 132* 129*  K 4.9  --  4.5 4.8 4.3  CL 96*  --  94* 97* 97*  CO2 21*  --  23 26 25   GLUCOSE 108*  --  184* 107* 101*  BUN 14  --  31* 23* 19  CREATININE 1.09 1.40* 1.43* 0.98 0.90  CALCIUM 9.7  --  9.4 9.3 9.0  ALKPHOS 57  --   --   --   --   AST 33  --   --   --   --   ALT 29  --   --   --   --   ALBUMIN 4.9  --   --   --   --      Results/Tests Pending at Time of Discharge:   Discharge Medications:  Allergies as of 07/24/2016   No Known Allergies     Medication List    STOP taking these medications   ibuprofen 200 MG tablet Commonly known as:  ADVIL,MOTRIN   SPIRIVA RESPIMAT 2.5 MCG/ACT Aers Generic drug:  Tiotropium Bromide Monohydrate Replaced by:  tiotropium 18 MCG inhalation capsule     TAKE these medications   albuterol (2.5 MG/3ML) 0.083% nebulizer solution Commonly known as:  PROVENTIL Take 2.5 mg by nebulization every 4 (four) hours as needed. For shortness of breath What changed:  Another medication with the same name was removed. Continue taking this medication, and follow the directions you see here.   atorvastatin 80 MG tablet Commonly known as:  LIPITOR Take 80 mg by mouth daily.   budesonide-formoterol 160-4.5 MCG/ACT inhaler Commonly known as:  SYMBICORT Inhale 2 puffs into the lungs 2 (two) times daily. What changed:  when to take this   buPROPion 100 MG tablet Commonly known as:  WELLBUTRIN Take 200 mg by mouth 2 (two) times daily.   doxycycline 100 MG tablet Commonly known as:  VIBRA-TABS Take 1 tablet (100 mg total) by mouth every 12 (twelve) hours. Start tonight (07/24/2016)   FLUoxetine 40 MG capsule Commonly known as:  PROZAC Take 60 mg by mouth daily.   gabapentin 300 MG capsule Commonly known as:  NEURONTIN Take 900 mg by mouth 2 (two) times daily.   guaiFENesin 200 MG tablet Take 3 tablets (600 mg total) by mouth every 4 (four) hours as needed. For cough What changed:  how much to take   lisinopril-hydrochlorothiazide 10-12.5 MG tablet Commonly known as:  PRINZIDE,ZESTORETIC Take 1 tablet by mouth daily.   methadone 10 MG tablet Commonly known as:  DOLOPHINE Take 10 mg by mouth daily.   prednisoLONE 5 MG Tabs tablet Take 10 tablets (50 mg total) by mouth daily. Starting 07/25/2016 Start taking on:  07/25/2016   tiotropium 18 MCG inhalation  capsule Commonly known as:  SPIRIVA Place 1 capsule (18 mcg total) into inhaler and inhale daily. Start taking on:  07/25/2016 Replaces:  SPIRIVA RESPIMAT 2.5 MCG/ACT Aers       Discharge Instructions: Please refer to Patient Instructions section of EMR for full details.  Patient was counseled important signs and symptoms that should prompt return to medical care, changes in medications, dietary instructions, activity restrictions, and follow up appointments.   Follow-Up Appointments: Follow-up Information    [facility]).   Why:  Hopital follow up on COPD Contact information: [address] [place] [address] [phone]           Almon HerculesGonfa, Taye T, MD 07/24/2016, 2:00 PM PGY-2, [facility]

## 2016-07-24 NOTE — Progress Notes (Signed)
SATURATION QUALIFICATIONS: (This note is used to comply with regulatory documentation for home oxygen)  Patient Saturations on Room Air at Rest = 95%  Patient Saturations on Room Air while Ambulating = 89%  Patient Saturations on 2 Liters of oxygen while Ambulating = 96%  Please briefly explain why patient needs home oxygen: 

## 2016-07-24 NOTE — Care Management (Signed)
  Spoke with patient's MD , he is cancelling home oxygen.    Order for oxygen cancelled . Oxygen saturation 89% on room air ambulating , does not qualify for home oxygen . Spoke with patient .   Discharge summary and prescriptions faxed to patient's PCP Dr Gerlene FeePiva . [facility], Selena BattenKim will have prescriptions filled .   Patient aware and voiced understanding to all of above.   Ronny FlurryHeather Shamariah Shewmake RN BSN [phone]

## 2016-07-24 NOTE — Care Management (Addendum)
  Paged MD family medicine pager, they are aware discharge summary and paper prescriptions needed so [facility].      Spoke to Carolynn [facility]Ashley Blackwell [facility] , she said to have patient use his Medicare to arrange home oxygen. Once VA MD sees patient at follow up appointment the VA will change home oxygen to his VA benefit. Ronny FlurryHeather Skylan Lara RN BSN [phone]    [facility] [phone] ext [phone]. Called Ms Para MarchDuncan again. Advised fax everything to [facility] at [phone] and call RT Carolynn ServeAshley Blackwell at [phone] ext [phone] . Same done awaiting call back. Ms Para MarchDuncan stated home oxygen will likely not be approved today. Again NCM started referral on Jul 22, 2016 .  For home health , Ms Para MarchDuncan instructed CM to fax [facility] order to Selena BattenKim , Dr Foye SpurlingPiva's nurse and VA will do a home health needs assessment , if Dr Gerlene FeePiva agrees with home health Dr Foye SpurlingPiva's office will arrange.  Ronny FlurryHeather Amyia Lodwick RN [phone]

## 2016-07-24 NOTE — Discharge Instructions (Addendum)
It has been a pleasure taking care of you! You were admitted due to COPD exacerbation. We have treated you with breathing treatments, steroid, antibiotics and oxygen. With that your symptoms improved to the point we think it is safe to let you go home and follow up with your primary care doctor. We are discharging you on antibiotic and steroid for two more days. There could be some changes made to your home medications during this hospitalization. Please, make sure to read the directions before you take them. The names and directions on how to take these medications are found on this discharge paper under medication section.  Please follow up at your primary care doctor's office. The address, date and time are found on the discharge paper under follow up section.  Take care,

## 2016-07-24 NOTE — Progress Notes (Signed)
Patient discharged to home with highlighted instructions and prescriptions, transported by wheelchair.

## 2016-07-24 NOTE — Progress Notes (Signed)
Patient was discharged home with instructions, inhalers, and prescriptions.

## 2016-07-26 LAB — CULTURE, BLOOD (ROUTINE X 2)
CULTURE: NO GROWTH
Culture: NO GROWTH
Special Requests: ADEQUATE
Special Requests: ADEQUATE

## 2016-11-23 ENCOUNTER — Emergency Department (HOSPITAL_COMMUNITY): Payer: Medicare Other

## 2016-11-23 ENCOUNTER — Encounter (HOSPITAL_COMMUNITY): Payer: Self-pay | Admitting: Emergency Medicine

## 2016-11-23 ENCOUNTER — Observation Stay (HOSPITAL_COMMUNITY)
Admission: EM | Admit: 2016-11-23 | Discharge: 2016-11-24 | Disposition: A | Discharge status: Home / Self Care | Payer: Medicare Other | Attending: Oncology | Admitting: Oncology

## 2016-11-23 DIAGNOSIS — Z79899 Other long term (current) drug therapy: Secondary | ICD-10-CM | POA: Diagnosis not present

## 2016-11-23 DIAGNOSIS — I2510 Atherosclerotic heart disease of native coronary artery without angina pectoris: Secondary | ICD-10-CM

## 2016-11-23 DIAGNOSIS — M549 Dorsalgia, unspecified: Secondary | ICD-10-CM | POA: Insufficient documentation

## 2016-11-23 DIAGNOSIS — J441 Chronic obstructive pulmonary disease with (acute) exacerbation: Secondary | ICD-10-CM | POA: Diagnosis not present

## 2016-11-23 DIAGNOSIS — Z955 Presence of coronary angioplasty implant and graft: Secondary | ICD-10-CM | POA: Insufficient documentation

## 2016-11-23 DIAGNOSIS — R0602 Shortness of breath: Secondary | ICD-10-CM | POA: Insufficient documentation

## 2016-11-23 DIAGNOSIS — Z87891 Personal history of nicotine dependence: Secondary | ICD-10-CM | POA: Diagnosis not present

## 2016-11-23 DIAGNOSIS — F1120 Opioid dependence, uncomplicated: Secondary | ICD-10-CM | POA: Insufficient documentation

## 2016-11-23 DIAGNOSIS — Z66 Do not resuscitate: Secondary | ICD-10-CM | POA: Insufficient documentation

## 2016-11-23 DIAGNOSIS — F329 Major depressive disorder, single episode, unspecified: Secondary | ICD-10-CM | POA: Insufficient documentation

## 2016-11-23 DIAGNOSIS — F419 Anxiety disorder, unspecified: Secondary | ICD-10-CM | POA: Insufficient documentation

## 2016-11-23 DIAGNOSIS — B370 Candidal stomatitis: Secondary | ICD-10-CM

## 2016-11-23 DIAGNOSIS — G8929 Other chronic pain: Secondary | ICD-10-CM | POA: Diagnosis not present

## 2016-11-23 DIAGNOSIS — K089 Disorder of teeth and supporting structures, unspecified: Secondary | ICD-10-CM

## 2016-11-23 DIAGNOSIS — I10 Essential (primary) hypertension: Secondary | ICD-10-CM | POA: Insufficient documentation

## 2016-11-23 DIAGNOSIS — E871 Hypo-osmolality and hyponatremia: Secondary | ICD-10-CM | POA: Diagnosis not present

## 2016-11-23 DIAGNOSIS — F1010 Alcohol abuse, uncomplicated: Secondary | ICD-10-CM | POA: Insufficient documentation

## 2016-11-23 DIAGNOSIS — I252 Old myocardial infarction: Secondary | ICD-10-CM | POA: Insufficient documentation

## 2016-11-23 LAB — CBC WITH DIFFERENTIAL/PLATELET
Basophils Absolute: 0.1 10*3/uL (ref 0.0–0.1)
Basophils Relative: 1 %
EOS PCT: 5 %
Eosinophils Absolute: 0.6 10*3/uL (ref 0.0–0.7)
HCT: 42 % (ref 39.0–52.0)
HEMOGLOBIN: 14.2 g/dL (ref 13.0–17.0)
LYMPHS ABS: 3.1 10*3/uL (ref 0.7–4.0)
LYMPHS PCT: 30 %
MCH: 29.3 pg (ref 26.0–34.0)
MCHC: 33.8 g/dL (ref 30.0–36.0)
MCV: 86.6 fL (ref 78.0–100.0)
MONOS PCT: 10 %
Monocytes Absolute: 1.1 10*3/uL — ABNORMAL HIGH (ref 0.1–1.0)
NEUTROS PCT: 54 %
Neutro Abs: 5.8 10*3/uL (ref 1.7–7.7)
Platelets: 185 10*3/uL (ref 150–400)
RBC: 4.85 MIL/uL (ref 4.22–5.81)
RDW: 13.7 % (ref 11.5–15.5)
WBC: 10.7 10*3/uL — AB (ref 4.0–10.5)

## 2016-11-23 LAB — BASIC METABOLIC PANEL
Anion gap: 12 (ref 5–15)
BUN: 11 mg/dL (ref 6–20)
CALCIUM: 9.5 mg/dL (ref 8.9–10.3)
CO2: 22 mmol/L (ref 22–32)
CREATININE: 0.99 mg/dL (ref 0.61–1.24)
Chloride: 93 mmol/L — ABNORMAL LOW (ref 101–111)
GFR calc non Af Amer: >60 mL/min (ref >60)
Glucose, Bld: 77 mg/dL (ref 65–99)
Potassium: 4.1 mmol/L (ref 3.5–5.1)
Sodium: 127 mmol/L — ABNORMAL LOW (ref 135–145)

## 2016-11-23 LAB — I-STAT ARTERIAL BLOOD GAS, ED
Bicarbonate: 25 mmol/L (ref 20.0–28.0)
O2 SAT: 98 %
Patient temperature: 98
TCO2: 26 mmol/L (ref 22–32)
pCO2 arterial: 39.9 mmHg (ref 32.0–48.0)
pH, Arterial: 7.404 (ref 7.350–7.450)
pO2, Arterial: 95 mmHg (ref 83.0–108.0)

## 2016-11-23 LAB — TROPONIN I: Troponin I: <0.03 ng/mL (ref <0.03)

## 2016-11-23 LAB — BRAIN NATRIURETIC PEPTIDE: B Natriuretic Peptide: 35.8 pg/mL (ref 0.0–100.0)

## 2016-11-23 MED ORDER — AZITHROMYCIN 250 MG PO TABS
500.0000 mg | ORAL_TABLET | Freq: Every day | ORAL | Status: AC
  Administered 2016-11-23: 500 mg via ORAL
  Filled 2016-11-23: qty 2

## 2016-11-23 MED ORDER — ENOXAPARIN SODIUM 40 MG/0.4ML ~~LOC~~ SOLN
40.0000 mg | SUBCUTANEOUS | Status: DC
  Administered 2016-11-23: 40 mg via SUBCUTANEOUS
  Filled 2016-11-23: qty 0.4

## 2016-11-23 MED ORDER — THIAMINE HCL 100 MG/ML IJ SOLN
100.0000 mg | Freq: Every day | INTRAMUSCULAR | Status: DC
  Administered 2016-11-24: 100 mg via INTRAVENOUS
  Filled 2016-11-23: qty 2

## 2016-11-23 MED ORDER — ALBUTEROL (5 MG/ML) CONTINUOUS INHALATION SOLN
10.0000 mg/h | INHALATION_SOLUTION | Freq: Once | RESPIRATORY_TRACT | Status: AC
  Administered 2016-11-23: 10 mg/h via RESPIRATORY_TRACT
  Filled 2016-11-23: qty 20

## 2016-11-23 MED ORDER — KETOROLAC TROMETHAMINE 30 MG/ML IJ SOLN
15.0000 mg | Freq: Once | INTRAMUSCULAR | Status: AC
  Administered 2016-11-23: 15 mg via INTRAVENOUS
  Filled 2016-11-23: qty 1

## 2016-11-23 MED ORDER — LORAZEPAM 2 MG/ML IJ SOLN: 1.0000 mg | Freq: Four times a day (QID) | INTRAMUSCULAR | Status: DC | PRN

## 2016-11-23 MED ORDER — MOMETASONE FURO-FORMOTEROL FUM 200-5 MCG/ACT IN AERO
2.0000 | INHALATION_SPRAY | Freq: Two times a day (BID) | RESPIRATORY_TRACT | Status: DC
  Administered 2016-11-23 – 2016-11-24 (×3): 2 via RESPIRATORY_TRACT
  Filled 2016-11-23: qty 8.8

## 2016-11-23 MED ORDER — ACETAMINOPHEN 325 MG PO TABS
650.0000 mg | ORAL_TABLET | Freq: Four times a day (QID) | ORAL | Status: DC | PRN
  Administered 2016-11-23 – 2016-11-24 (×2): 650 mg via ORAL
  Filled 2016-11-23 (×2): qty 2

## 2016-11-23 MED ORDER — LISINOPRIL 10 MG PO TABS
10.0000 mg | ORAL_TABLET | Freq: Every day | ORAL | Status: DC
  Administered 2016-11-23 – 2016-11-24 (×2): 10 mg via ORAL
  Filled 2016-11-23 (×2): qty 1

## 2016-11-23 MED ORDER — ALBUTEROL SULFATE (2.5 MG/3ML) 0.083% IN NEBU
2.5000 mg | INHALATION_SOLUTION | RESPIRATORY_TRACT | Status: DC | PRN
  Administered 2016-11-23 – 2016-11-24 (×2): 2.5 mg via RESPIRATORY_TRACT
  Filled 2016-11-23 (×3): qty 3

## 2016-11-23 MED ORDER — FOLIC ACID 1 MG PO TABS
1.0000 mg | ORAL_TABLET | Freq: Every day | ORAL | Status: DC
  Administered 2016-11-24: 1 mg via ORAL
  Filled 2016-11-23: qty 1

## 2016-11-23 MED ORDER — IPRATROPIUM-ALBUTEROL 0.5-2.5 (3) MG/3ML IN SOLN
3.0000 mL | Freq: Once | RESPIRATORY_TRACT | Status: AC
  Administered 2016-11-23: 3 mL via RESPIRATORY_TRACT
  Filled 2016-11-23: qty 3

## 2016-11-23 MED ORDER — LORAZEPAM 1 MG PO TABS
1.0000 mg | ORAL_TABLET | Freq: Four times a day (QID) | ORAL | Status: DC | PRN
  Administered 2016-11-23 – 2016-11-24 (×2): 1 mg via ORAL
  Filled 2016-11-23 (×3): qty 1

## 2016-11-23 MED ORDER — BUPROPION HCL 100 MG PO TABS
200.0000 mg | ORAL_TABLET | Freq: Two times a day (BID) | ORAL | Status: DC
  Administered 2016-11-23 – 2016-11-24 (×2): 200 mg via ORAL
  Filled 2016-11-23 (×4): qty 2

## 2016-11-23 MED ORDER — GABAPENTIN 300 MG PO CAPS
900.0000 mg | ORAL_CAPSULE | Freq: Two times a day (BID) | ORAL | Status: DC
  Administered 2016-11-23 – 2016-11-24 (×3): 900 mg via ORAL
  Filled 2016-11-23 (×3): qty 3

## 2016-11-23 MED ORDER — IPRATROPIUM-ALBUTEROL 0.5-2.5 (3) MG/3ML IN SOLN
3.0000 mL | Freq: Four times a day (QID) | RESPIRATORY_TRACT | Status: DC
  Administered 2016-11-23 – 2016-11-24 (×5): 3 mL via RESPIRATORY_TRACT
  Filled 2016-11-23 (×5): qty 3

## 2016-11-23 MED ORDER — MAGNESIUM SULFATE 2 GM/50ML IV SOLN
2.0000 g | Freq: Once | INTRAVENOUS | Status: AC
  Administered 2016-11-23: 2 g via INTRAVENOUS
  Filled 2016-11-23: qty 50

## 2016-11-23 MED ORDER — METHADONE HCL 10 MG PO TABS
10.0000 mg | ORAL_TABLET | Freq: Every day | ORAL | Status: DC
  Administered 2016-11-23 – 2016-11-24 (×2): 10 mg via ORAL
  Filled 2016-11-23 (×2): qty 1

## 2016-11-23 MED ORDER — SODIUM CHLORIDE 0.9 % IV BOLUS (SEPSIS)
1000.0000 mL | Freq: Once | INTRAVENOUS | Status: AC
  Administered 2016-11-23: 1000 mL via INTRAVENOUS

## 2016-11-23 MED ORDER — AZITHROMYCIN 250 MG PO TABS
250.0000 mg | ORAL_TABLET | Freq: Every day | ORAL | Status: DC
  Administered 2016-11-24: 250 mg via ORAL
  Filled 2016-11-23: qty 1

## 2016-11-23 MED ORDER — VITAMIN B-1 100 MG PO TABS
100.0000 mg | ORAL_TABLET | Freq: Every day | ORAL | Status: DC
  Filled 2016-11-23: qty 1

## 2016-11-23 MED ORDER — FLUOXETINE HCL 20 MG PO CAPS
60.0000 mg | ORAL_CAPSULE | Freq: Every day | ORAL | Status: DC
  Administered 2016-11-24: 60 mg via ORAL
  Filled 2016-11-23: qty 3

## 2016-11-23 MED ORDER — PREDNISONE 20 MG PO TABS
40.0000 mg | ORAL_TABLET | Freq: Every day | ORAL | Status: DC
  Administered 2016-11-24: 40 mg via ORAL
  Filled 2016-11-23: qty 2

## 2016-11-23 MED ORDER — HYDROCHLOROTHIAZIDE 12.5 MG PO CAPS
12.5000 mg | ORAL_CAPSULE | Freq: Every day | ORAL | Status: DC
  Administered 2016-11-23 – 2016-11-24 (×2): 12.5 mg via ORAL
  Filled 2016-11-23 (×2): qty 1

## 2016-11-23 MED ORDER — ATORVASTATIN CALCIUM 80 MG PO TABS
80.0000 mg | ORAL_TABLET | Freq: Every day | ORAL | Status: DC
  Administered 2016-11-24: 80 mg via ORAL
  Filled 2016-11-23: qty 1

## 2016-11-23 MED ORDER — ACETAMINOPHEN 650 MG RE SUPP: 650.0000 mg | Freq: Four times a day (QID) | RECTAL | Status: DC | PRN

## 2016-11-23 MED ORDER — LISINOPRIL-HYDROCHLOROTHIAZIDE 10-12.5 MG PO TABS: 1.0000 | ORAL_TABLET | Freq: Every day | ORAL | Status: DC

## 2016-11-23 MED ORDER — ADULT MULTIVITAMIN W/MINERALS CH
1.0000 | ORAL_TABLET | Freq: Every day | ORAL | Status: DC
  Administered 2016-11-24: 1 via ORAL
  Filled 2016-11-23: qty 1

## 2016-11-23 NOTE — ED Notes (Signed)
Attempted to call report

## 2016-11-23 NOTE — ED Notes (Signed)
Water provided to patient

## 2016-11-23 NOTE — ED Notes (Signed)
ED Provider at bedside. 

## 2016-11-23 NOTE — ED Triage Notes (Signed)
BIB EMS from home, called out for SOB X2 weeks. Pt had diminished/absent lung sounds upon their arrival. Started on breathing tx, given 10 albuterol, 1 atrovent, 125 solumedrol. Labored breathing noted, accessory muscle use. 94% RA.

## 2016-11-23 NOTE — ED Notes (Signed)
Pt request door remain opoen

## 2016-11-23 NOTE — ED Provider Notes (Signed)
Medical screening examination/treatment/procedure(s) were conducted as a shared visit with non-physician practitioner(s) and myself.  I personally evaluated the patient during the encounter.   EKG Interpretation  Date/Time:  Sunday November 23 2016 05:36:31 EDT Ventricular Rate:  92 PR Interval:    QRS Duration: 105 QT Interval:  335 QTC Calculation: 415 R Axis:   78 Text Interpretation:  Sinus rhythm Repol abnrm suggests ischemia, inferior leads Baseline wander  No acute changes No significant change since last tracing Confirmed by Derwood Kaplan [phone]) on 11/23/2016 6:06:12 AM        Results for orders placed or performed during the hospital encounter of 11/23/16  Basic metabolic panel  Result Value Ref Range   Sodium 127 (L) 135 - 145 mmol/L   Potassium 4.1 3.5 - 5.1 mmol/L   Chloride 93 (L) 101 - 111 mmol/L   CO2 22 22 - 32 mmol/L   Glucose, Bld 77 65 - 99 mg/dL   BUN 11 6 - 20 mg/dL   Creatinine, Ser 6.04 0.61 - 1.24 mg/dL   Calcium 9.5 8.9 - 54.0 mg/dL   GFR calc non Af Amer >60 >60 mL/min   GFR calc Af Amer >60 >60 mL/min   Anion gap 12 5 - 15  CBC WITH DIFFERENTIAL  Result Value Ref Range   WBC 10.7 (H) 4.0 - 10.5 K/uL   RBC 4.85 4.22 - 5.81 MIL/uL   Hemoglobin 14.2 13.0 - 17.0 g/dL   HCT 98.1 19.1 - 47.8 %   MCV 86.6 78.0 - 100.0 fL   MCH 29.3 26.0 - 34.0 pg   MCHC 33.8 30.0 - 36.0 g/dL   RDW 29.5 62.1 - 30.8 %   Platelets 185 150 - 400 K/uL   Neutrophils Relative % 54 %   Neutro Abs 5.8 1.7 - 7.7 K/uL   Lymphocytes Relative 30 %   Lymphs Abs 3.1 0.7 - 4.0 K/uL   Monocytes Relative 10 %   Monocytes Absolute 1.1 (H) 0.1 - 1.0 K/uL   Eosinophils Relative 5 %   Eosinophils Absolute 0.6 0.0 - 0.7 K/uL   Basophils Relative 1 %   Basophils Absolute 0.1 0.0 - 0.1 K/uL  Troponin I  Result Value Ref Range   Troponin I <0.03 <0.03 ng/mL  Brain natriuretic peptide  Result Value Ref Range   B Natriuretic Peptide 35.8 0.0 - 100.0 pg/mL  I-Stat Arterial Blood  Gas, ED - (order at [facility] and [facility] only)  Result Value Ref Range   pH, Arterial 7.404 7.350 - 7.450   pCO2 arterial 39.9 32.0 - 48.0 mmHg   pO2, Arterial 95.0 83.0 - 108.0 mmHg   Bicarbonate 25.0 20.0 - 28.0 mmol/L   TCO2 26 22 - 32 mmol/L   O2 Saturation 98.0 %   Patient temperature 98.0 F    Collection site RADIAL, ALLEN'S TEST ACCEPTABLE    Sample type ARTERIAL    Dg Chest 2 View  Result Date: 11/23/2016 CLINICAL DATA:  Shortness of breath for 2 weeks, worsening tonight. History of COPD hypertension. EXAM: CHEST  2 VIEW COMPARISON:  Chest radiograph Jul 21, 2016 FINDINGS: Cardiomediastinal silhouette is normal. No pleural effusions or focal consolidations. Hyperinflation with flattened hemidiaphragms. Trachea projects midline and there is no pneumothorax. Soft tissue planes and included osseous structures are non-suspicious. ACDF. IMPRESSION: COPD.  No focal consolidation. Aortic Atherosclerosis (ICD10-I70.0). Electronically Signed   By: Awilda Metro M.D.   On: 11/23/2016 06:37   Patient seen by me along with the physician  assistant.   Patient with known history of COPD. Patient brought in by EMS. Patient received breathing treatment and Solu-Medrol by EMS. Despite the additional nebulizer treatments here patient has had persistent wheezing. Patient oxygen saturations low 90s but he does seem to be struggling to breathe. Patient Rick requesting BiPAP. So patient started on BiPAP more for his own comfort and for objective numbers. Patient's blood gas also without any acute abnormalities. Patient will receive another nebulizer treatment. Patient will require admission. Hopefully steroids will kick in and wheezing will resolve. Patient also received magnesium. Labs are significant for a mild hyponatremia. Slight elevation in white blood cell count. Troponin was negative. BMP without any acute findings.  [facility] [place]   CRITICAL  CARE Performed by: Vanetta MuldersZACKOWSKI,Dorethia Jeanmarie Total critical care time: 30 minutes Critical care time was exclusive of separately billable procedures and treating other patients. Critical care was necessary to treat or prevent imminent or life-threatening deterioration. Critical care was time spent personally by me on the following activities: development of treatment plan with patient and/or surrogate as well as nursing, discussions with consultants, evaluation of patient's response to treatment, examination of patient, obtaining history from patient or surrogate, ordering and performing treatments and interventions, ordering and review of laboratory studies, ordering and review of radiographic studies, pulse oximetry and re-evaluation of patient's condition.    Vanetta MuldersZackowski, Brekyn Huntoon, MD 11/23/16 1016

## 2016-11-23 NOTE — Progress Notes (Signed)
Admission note:  Arrival Method: via wheelchair from ED Mental Orientation: Alert and oriented. Assessment: see doc flow sheet. Skin: warm and dry. IV: L AC NSL. Pain: denies Tubes: None Safety Measures: Discussed with pt, verbalized understanding.  Fall Prevention Safety Plan: reviewed with pt. Admission Screening: in progress 2000 Orientation: Patient has been oriented to the unit, staff and to the room.  Orders have been reviewed and implemented, call bell and belongings within reach, will continue to monitor.

## 2016-11-23 NOTE — H&P (Signed)
Date: 11/23/2016               Patient Name:  Austin Maynard MRN: [id number]  DOB: 08/29/1950 Age / Sex: 66 y.o., male   [facility]         Medical Service: Internal Medicine Teaching Service         Attending Physician: Dr. Vanetta MuldersZackowski, Scott, MD    First Contact: Dr. Rogue BussingSantos-Snchez Pager: [phone]  Second Contact: Dr. Allena KatzPatel  Pager: [phone]       After Hours (After 5p/  First Contact Pager: [phone]  weekends / holidays): Second Contact Pager: [phone]   Chief Complaint: Shortness of breath and productive cough   History of Present Illness:  Austin Bruntaniel L. Geanie Maynard is a 66 y.o. male with history of COPD (no PFTs available to review), CAD, HTN, alcohol use disorder, chronic back pain on methadone, depression/anxiety who presents with dyspnea and productive cough. Of note, patient [facility] and records are not available. He is mildly short of breath at baseline, but the shortness of breath has progressively worsened in the last 3 weeks. Does not use oxygen at home  He also endorses a productive cough of clear sputum that comes and goes. Denies changes in sputum purulence. He is on albuterol, Combivent, Spiriva, and Symbicort and reports compliance. Per discharge summary from 07/2016, patient was discharged only on Spiriva and Dulera, as well as albuterol PRN. It was also noted that patient excessively uses albuterol and combivent at home. Denies recent illness, fever,chills, and chest pain. He denies history of COPD exacerbation requiring intubation in the past. EMS gave 10 mg albuterol, ipratropium, and Solumedrol 125 mg x1.   ED course: He was tachycardic HR 110 and hypertensive 168/90s in the ED after receiving several albuterol treatments. He was initially on 8L of oxygen by [place] satting 99% and was escalated to BiPAP per his request. He also received duonebs x1, continuous nebs, 1L NS bolus and IV Mag x1. EKG was normal and CXR negative for an  acute process. Patient was not in acute distress when seen and we were able to weaned him off of BiPAP during encounter with no dyspnea or hypoxia noted.   Meds:  Current Meds  Medication Sig  . albuterol (PROVENTIL) (2.5 MG/3ML) 0.083% nebulizer solution Take 2.5 mg by nebulization every 4 (four) hours as needed for wheezing or shortness of breath.   Marland Kitchen. atorvastatin (LIPITOR) 80 MG tablet Take 80 mg by mouth daily.  . budesonide-formoterol (SYMBICORT) 160-4.5 MCG/ACT inhaler Inhale 2 puffs into the lungs 2 (two) times daily.  Marland Kitchen. buPROPion (WELLBUTRIN) 100 MG tablet Take 200 mg by mouth 2 (two) times daily.  Marland Kitchen. FLUoxetine (PROZAC) 20 MG capsule Take 60 mg by mouth daily.   Marland Kitchen. gabapentin (NEURONTIN) 300 MG capsule Take 900 mg by mouth 2 (two) times daily.  Marland Kitchen. lisinopril-hydrochlorothiazide (PRINZIDE,ZESTORETIC) 10-12.5 MG tablet Take 1 tablet by mouth daily.  . methadone (DOLOPHINE) 10 MG tablet Take 10 mg by mouth daily.  Marland Kitchen. PRESCRIPTION MEDICATION Take 0.5 tablets by mouth 2 (two) times daily. [place]  . tiotropium (SPIRIVA) 18 MCG inhalation capsule Place 1 capsule (18 mcg total) into inhaler and inhale daily.     Allergies: Allergies as of 11/23/2016  . (No Known Allergies)   Past Medical History:  Diagnosis Date  . Back pain   . COPD (chronic obstructive pulmonary disease) (HCC)   . Depression    /  notes 07/21/2016  . Disc degeneration   . Hypertension   . 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   . Pneumonia 06/2016   recent/notes 07/21/2016 (07/21/2016)    Family History:  Heart disease - father, mother, maternal grandmother   Social History:  Patient drinks 1-2 20 oz cans of beer. States smokes on and off, but has not smoke "in a while ." Denies recreational drug use.   Review of Systems: A complete ROS was negative except as per HPI.   Physical Exam: Blood pressure (!) 168/95, pulse (!) 103, temperature 98 F (36.7 C), temperature source Oral, resp. rate 19, height 6'  (1.829 m), weight 245 lb (111.1 kg), SpO2 99 %.  General: disheveled male, well-nourished, well-developed, able to speak in full sentences  Cardiac: regular rate and rhythm, nl S1/S2, no murmurs, rubs or gallops  Pulm: diffuse coarse breath sounds with scattered expiratory wheezes, no increased work of breathing  Abd: soft, NTND, bowel sounds present  Ext: warm and well perfused, no peripheral edema, 2+ DP pulses bilaterally    Labs:  CBC WBC 10.7 H/H 14.2/42 Plt 185 BMP 127  4.1  93  22  11  0.99  77 ABG 7.4/39/95/25 BNP 35.8 Troponin negative x1  EKG: personally reviewed my interpretation is SR at 92 bpm, nl intervals, no axis deviation, no signs of acute ischemia noted   CXR: personally reviewed my interpretation is patent airway, no enlargement of cardiac silhouette, no hyperinflation of the lungs, mild flattening of R diaphragm, no consolidations/opacities/effusions noted   Assessment & Plan by Problem:  Austin Maynard is a 66 y.o. male with history of COPD, CAD, HTN, alcohol use disorder, chronic back pain on methadone, depression/anxiety who presents with dyspnea and productive cough concerning for COPD exacerbation. On exam, he is tachycardic after receiving several treatments of albuterol, and has diffuse coarse breath sounds with scattered expiratory wheezes. Lab work significantly for hyponatremia of 127, with unknown baseline as he is a [place] patient and records are not available to review. Sodium 128-132 on previous admission from 07/2016 and 136 5 years ago.   # COPD exacerbation: Patient reports gradual worsening of dyspnea associated with increased sputum volume for the past 3 weeks. In the ED, he was initially on 8L [place] satting 99% and was escalated to BiPAP per his request. He also received Solumedrol 125 mg x1. Weaned off of BiPAP during our encounter with no hypoxia or dyspnea noted. He denies history of COPD exacerbation requiring intubation in the past. No PFTs available  to review. Respiratory status currently stable.  - s/p IV Mag x1 and 1L NS bolus  - Scheduled duonebs q6h  - Albuterol q4h PRN  - S/p Solumedrol 125 mg by EMS. Will start Prednisone 40 mg x4 days on 9/10.  - Azithromycin 250 mg daily x4 days   #Hyponatremia: Na 127 on admission with ?baseline 127-132 (from previous admission in 07/2016) in the setting of alcohol use disorder. Baseline is unknown patient receives his care at the [facility] and records are not available.  - Will continue to monitor   # HTN: Hypertensive today with sBP 134-160s.  - Continue home lisinopril-HCTZ 10-12.5 mg daily   # Chronic back pain: on methadone 10 mg daily  - Continue home methadone 10 mg daily + gabapentin 900 mg BID   # Alcohol use disorder: No history of withdrawals symptoms, per patient.  - CIWA protocol with Ativan ordered - Folate 1 mg daily +  IV thiamine 100 mg x1 followed by PO thiamine  daily   # Depression/anxiety:  - Continue home Prozac 60 mg daily and Wellbutrin   # ?CAD s/p stent 2000 - Continue home atorvastatin 80 mg daily   F: None  E: Will monitor and replete as needed  N: Regular diet VTE ppx: SQ Lovenox  Code status: DNR/DNI, confirmed on admission   Dispo: Admit patient to Observation with expected length of stay less than 2 midnights.  Signed: Burna Cash, MD  Internal Medicine PGY-1  P [phone]

## 2016-11-23 NOTE — ED Notes (Signed)
On entering room pt request urinal and request side rail down. Pt states he does not use cane walker and has not fallen. Pt refuses help or standby assist pt refuses to stay in bed. Pt offered male assist and refuses. Pt demands I close door.

## 2016-11-23 NOTE — ED Provider Notes (Signed)
[facility]-EMERGENCY DEPT Provider Note   CSN: [id number] Arrival date & time: 11/23/16  1324     History   Chief Complaint Chief Complaint  Patient presents with  . Shortness of Breath    HPI Austin Maynard is a 66 y.o. male who presents with SOB. PMH significant for COPD, HTN, hx of CAD, alcohol abuse, chronic back pain on Methadone, depression/anxiety. He states he has been progressively more SOB over the past 2 weeks. He reports a productive cough and wheezing. He denies fever, chills, chest pain, leg swelling, palpitations, abdominal pain, N/V. He states he is not a smoker any more but smoked for most of his life and was exposed to second hand smoke. He was given  albuterol, ipratropium, and  Solumedrol by EMS prior to arrival. He is a [place] patient.   HPI  Past Medical History:  Diagnosis Date  . Back pain   . COPD (chronic obstructive pulmonary disease) (HCC)   . Depression    Hattie Perch 07/21/2016  . Disc degeneration   . Hypertension   . 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   . Pneumonia 06/2016   recent/notes 07/21/2016 (07/21/2016)    Patient Active Problem List   Diagnosis Date Noted  . COPD exacerbation (HCC) 07/21/2016  . Alcohol abuse 07/21/2016  . Essential hypertension, benign 07/21/2016  . Hyponatremia 07/21/2016  . Chronic pain syndrome 07/21/2016  . 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 BACK SURGERY    . CORONARY ANGIOPLASTY WITH STENT PLACEMENT     hx/notes 07/21/2016  . NECK SURGERY    . ROTATOR CUFF REPAIR         Home Medications    Prior to Admission medications   Medication Sig Start Date End Date Taking? Authorizing Provider  albuterol (PROVENTIL) (2.5 MG/3ML) 0.083% nebulizer solution Take 2.5 mg by nebulization every 4 (four) hours as needed for wheezing or shortness of breath.    Yes [provider]  atorvastatin (LIPITOR) 80 MG tablet Take 80 mg by mouth daily.   Yes [provider]  budesonide-formoterol (SYMBICORT) 160-4.5 MCG/ACT inhaler Inhale 2 puffs into the lungs 2 (two) times daily. 07/24/16  Yes Almon Hercules, MD  buPROPion (WELLBUTRIN) 100 MG tablet Take 200 mg by mouth 2 (two) times daily.   Yes [provider]  FLUoxetine (PROZAC) 20 MG capsule Take 60 mg by mouth daily.    Yes [provider]  gabapentin (NEURONTIN) 300 MG capsule Take 900 mg by mouth 2 (two) times daily.   Yes [provider]  lisinopril-hydrochlorothiazide (PRINZIDE,ZESTORETIC) 10-12.5 MG tablet Take 1 tablet by mouth daily.   Yes [provider]  methadone (DOLOPHINE) 10 MG tablet Take 10 mg by mouth daily.   Yes [provider]  PRESCRIPTION MEDICATION Take 0.5 tablets by mouth 2 (two) times daily. Heart medication from [place]   Yes [provider]  tiotropium (SPIRIVA) 18 MCG inhalation capsule Place 1 capsule (18 mcg total) into inhaler and inhale daily. 07/25/16  Yes Almon Hercules, MD  guaiFENesin 200 MG tablet Take 3 tablets (600 mg total) by mouth every 4 (four) hours as needed. For cough Patient not taking: Reported on 11/23/2016 07/24/16   Almon Hercules, MD    Family History No family history on file.  Social History Social History  Substance Use Topics  . Smoking status: Former Games developer  . Smokeless tobacco: Never Used  . Alcohol  use Yes     Allergies   Patient has no known allergies.   Review of Systems Review of Systems  Constitutional: Positive for diaphoresis. Negative for chills and fever.  Respiratory: Positive for cough, shortness of breath and wheezing.   Cardiovascular: Negative for chest pain, palpitations and leg swelling.  Gastrointestinal: Negative for abdominal pain, nausea and vomiting.  All other systems reviewed and are negative.    Physical Exam Updated Vital Signs BP 134/73 (BP Location: Right Arm)   Pulse 92   Temp 98 F (36.7 C) (Oral)   Resp 15   Ht 6' (1.829 m)   Wt 111.1 kg  (245 lb)   SpO2 99%   BMI 33.23 kg/m   Physical Exam  Constitutional: He is oriented to person, place, and time. He appears well-developed and well-nourished. No distress.  Disheveled  HENT:  Head: Normocephalic and atraumatic.  Eyes: Pupils are equal, round, and reactive to light. Conjunctivae are normal. Right eye exhibits no discharge. Left eye exhibits no discharge. No scleral icterus.  Neck: Normal range of motion.  Cardiovascular: Normal rate and regular rhythm.  Exam reveals no gallop and no friction rub.   No murmur heard. Pulmonary/Chest: Effort normal. Tachypnea noted. No respiratory distress. He has wheezes (diffuse inspiratory and expiratory). He has rhonchi (diffuse). He has no rales.  Abdominal: He exhibits no distension.  Neurological: He is alert and oriented to person, place, and time.  Skin: Skin is warm and dry.  Psychiatric: He has a normal mood and affect. He is agitated. He expresses impulsivity.  Nursing note and vitals reviewed.    ED Treatments / Results  Labs (all labs ordered are listed, but only abnormal results are displayed) Labs Reviewed  BASIC METABOLIC PANEL - Abnormal; Notable for the following:       Result Value   Sodium 127 (*)    Chloride 93 (*)    All other components within normal limits  CBC WITH DIFFERENTIAL/PLATELET - Abnormal; Notable for the following:    WBC 10.7 (*)    Monocytes Absolute 1.1 (*)    All other components within normal limits  TROPONIN I  BRAIN NATRIURETIC PEPTIDE  I-STAT ARTERIAL BLOOD GAS, ED    EKG  EKG Interpretation  Date/Time:  Sunday November 23 2016 05:36:31 EDT Ventricular Rate:  92 PR Interval:    QRS Duration: 105 QT Interval:  335 QTC Calculation: 415 R Axis:   78 Text Interpretation:  Sinus rhythm Repol abnrm suggests ischemia, inferior leads Baseline wander  No acute changes No significant change since last tracing Confirmed by Derwood Kaplananavati, Ankit [id number]) on 11/23/2016 6:06:12 AM        Radiology Dg Chest 2 View  Result Date: 11/23/2016 CLINICAL DATA:  Shortness of breath for 2 weeks, worsening tonight. History of COPD hypertension. EXAM: CHEST  2 VIEW COMPARISON:  Chest radiograph Jul 21, 2016 FINDINGS: Cardiomediastinal silhouette is normal. No pleural effusions or focal consolidations. Hyperinflation with flattened hemidiaphragms. Trachea projects midline and there is no pneumothorax. Soft tissue planes and included osseous structures are non-suspicious. ACDF. IMPRESSION: COPD.  No focal consolidation. Aortic Atherosclerosis (ICD10-I70.0). Electronically Signed   By: Awilda Metroourtnay  Bloomer M.D.   On: 11/23/2016 06:37    Procedures Procedures (including critical care time)  Medications Ordered in ED Medications  albuterol (PROVENTIL,VENTOLIN) solution continuous neb (10 mg/hr Nebulization Given 11/23/16 0636)  magnesium sulfate IVPB 2 g 50 mL (0 g Intravenous Stopped 11/23/16 0740)  sodium chloride 0.9 % bolus 1,000 mL (  1,000 mLs Intravenous New Bag/Given 11/23/16 0853)  ipratropium-albuterol (DUONEB) 0.5-2.5 (3) MG/3ML nebulizer solution 3 mL (3 mLs Nebulization Given 11/23/16 0928)  ketorolac (TORADOL) 30 MG/ML injection 15 mg (15 mg Intravenous Given 11/23/16 0929)     Initial Impression / Assessment and Plan / ED Course  I have reviewed the triage vital signs and the nursing notes.  Pertinent labs & imaging results that were available during my care of the patient were reviewed by me and considered in my medical decision making (see chart for details).  1:60 AM: 66 year old male with COPD exacerbation. He has wheezing and rhonchi on exam. He is on 8L via [place]. Pt is somewhat difficult and oppositional with history. Continuous neb and Mg ordered. CBC show mild leukocytosis of 10.7. BMP is remarkable for hyponatremia (127), hypochloremia (93). He does have hx of alcohol abuse. EKG is SR and CXR shows COPD without consolidation.  8:51 AM He is still not better. He is upset and  requesting Bipap stating that is the "only thing that made me better". ABG and Bipap along with Duoneb ordered. Of note, he is demanding and will not lie back in bed despite nursing attempts to have him stay in bed.    9:50 AM ABG shows pH is 7.404, CO2 39. Will keep him on Bipap at this time for comfort but feel he can come off of this at any time. He is still wheezing and tachypneic. Shared visit with Dr. Deretha Emory. Consult for unassigned admission. Spoke to teaching service who will admit.    Final Clinical Impressions(s) / ED Diagnoses   Final diagnoses:  COPD exacerbation [facility])    New Prescriptions New Prescriptions   No medications on file     Beryle Quant 11/23/16 1006    Derwood Kaplan, MD 11/23/16 2310

## 2016-11-23 NOTE — ED Notes (Signed)
Pt sitting at end of bed with feet hanging off. Offered to reposition or move but pt declines. Water given to pt. MD aware of pt position.

## 2016-11-23 NOTE — ED Notes (Signed)
Pt noted as sitting at end of bed with bipap removed. States that MD removed mask.

## 2016-11-23 NOTE — ED Notes (Signed)
Crackers and po fluids given to patient.

## 2016-11-24 DIAGNOSIS — M549 Dorsalgia, unspecified: Secondary | ICD-10-CM | POA: Diagnosis not present

## 2016-11-24 DIAGNOSIS — I2510 Atherosclerotic heart disease of native coronary artery without angina pectoris: Secondary | ICD-10-CM | POA: Diagnosis not present

## 2016-11-24 DIAGNOSIS — B370 Candidal stomatitis: Secondary | ICD-10-CM

## 2016-11-24 DIAGNOSIS — F418 Other specified anxiety disorders: Secondary | ICD-10-CM | POA: Diagnosis not present

## 2016-11-24 DIAGNOSIS — F17210 Nicotine dependence, cigarettes, uncomplicated: Secondary | ICD-10-CM

## 2016-11-24 DIAGNOSIS — Z79891 Long term (current) use of opiate analgesic: Secondary | ICD-10-CM

## 2016-11-24 DIAGNOSIS — I10 Essential (primary) hypertension: Secondary | ICD-10-CM

## 2016-11-24 DIAGNOSIS — G8929 Other chronic pain: Secondary | ICD-10-CM

## 2016-11-24 DIAGNOSIS — Z955 Presence of coronary angioplasty implant and graft: Secondary | ICD-10-CM | POA: Diagnosis not present

## 2016-11-24 DIAGNOSIS — Z79899 Other long term (current) drug therapy: Secondary | ICD-10-CM

## 2016-11-24 DIAGNOSIS — K0889 Other specified disorders of teeth and supporting structures: Secondary | ICD-10-CM

## 2016-11-24 DIAGNOSIS — Z66 Do not resuscitate: Secondary | ICD-10-CM

## 2016-11-24 DIAGNOSIS — I252 Old myocardial infarction: Secondary | ICD-10-CM

## 2016-11-24 DIAGNOSIS — J441 Chronic obstructive pulmonary disease with (acute) exacerbation: Secondary | ICD-10-CM | POA: Diagnosis not present

## 2016-11-24 DIAGNOSIS — Z7951 Long term (current) use of inhaled steroids: Secondary | ICD-10-CM

## 2016-11-24 DIAGNOSIS — E871 Hypo-osmolality and hyponatremia: Secondary | ICD-10-CM | POA: Diagnosis not present

## 2016-11-24 DIAGNOSIS — Z8249 Family history of ischemic heart disease and other diseases of the circulatory system: Secondary | ICD-10-CM

## 2016-11-24 DIAGNOSIS — K089 Disorder of teeth and supporting structures, unspecified: Secondary | ICD-10-CM

## 2016-11-24 DIAGNOSIS — Z7289 Other problems related to lifestyle: Secondary | ICD-10-CM

## 2016-11-24 LAB — CBC
HCT: 41 % (ref 39.0–52.0)
Hemoglobin: 13.8 g/dL (ref 13.0–17.0)
MCH: 29.3 pg (ref 26.0–34.0)
MCHC: 33.7 g/dL (ref 30.0–36.0)
MCV: 87 fL (ref 78.0–100.0)
PLATELETS: 205 10*3/uL (ref 150–400)
RBC: 4.71 MIL/uL (ref 4.22–5.81)
RDW: 13.6 % (ref 11.5–15.5)
WBC: 14.9 10*3/uL — AB (ref 4.0–10.5)

## 2016-11-24 LAB — BASIC METABOLIC PANEL
Anion gap: 10 (ref 5–15)
BUN: 11 mg/dL (ref 6–20)
CALCIUM: 9.4 mg/dL (ref 8.9–10.3)
CO2: 24 mmol/L (ref 22–32)
CREATININE: 0.91 mg/dL (ref 0.61–1.24)
Chloride: 97 mmol/L — ABNORMAL LOW (ref 101–111)
Glucose, Bld: 118 mg/dL — ABNORMAL HIGH (ref 65–99)
Potassium: 4.1 mmol/L (ref 3.5–5.1)
SODIUM: 131 mmol/L — AB (ref 135–145)

## 2016-11-24 MED ORDER — PREDNISONE 20 MG PO TABS: 20.0000 mg | ORAL_TABLET | Freq: Every day | ORAL | 0 refills | Status: DC

## 2016-11-24 MED ORDER — AZITHROMYCIN 250 MG PO TABS: ORAL_TABLET | ORAL | 0 refills | Status: AC

## 2016-11-24 MED ORDER — FLUCONAZOLE 100 MG PO TABS: 100.0000 mg | ORAL_TABLET | Freq: Every day | ORAL | Status: DC

## 2016-11-24 MED ORDER — FLUCONAZOLE 100 MG PO TABS
200.0000 mg | ORAL_TABLET | Freq: Once | ORAL | Status: AC
  Administered 2016-11-24: 200 mg via ORAL
  Filled 2016-11-24: qty 2

## 2016-11-24 MED ORDER — FLUCONAZOLE 100 MG PO TABS: 100.0000 mg | ORAL_TABLET | Freq: Every day | ORAL | 0 refills | Status: AC

## 2016-11-24 MED ORDER — PREDNISONE 20 MG PO TABS: 20.0000 mg | ORAL_TABLET | Freq: Every day | ORAL | 0 refills | Status: AC

## 2016-11-24 MED ORDER — AZITHROMYCIN 250 MG PO TABS: ORAL_TABLET | ORAL | 0 refills | Status: DC

## 2016-11-24 MED ORDER — FLUCONAZOLE 100 MG PO TABS
100.0000 mg | ORAL_TABLET | Freq: Every day | ORAL | 0 refills | Status: DC
Start: 2016-11-25 — End: 2016-11-24

## 2016-11-24 NOTE — Discharge Summary (Signed)
Name: Austin Maynard MRN: [id number] DOB: 11/08/1950 66 y.o. P[facility]  Date of Admission: 11/23/2016  5:28 AM Date of Discharge: 11/24/2016 Attending Physician: No att. providers found  Discharge Diagnosis: 1. COPD exacerbation  Active Problems:   COPD with acute exacerbation (HCC)   Thrush, oral   Poor dentition   Atherosclerosis of native coronary artery of native heart without angina pectoris   Discharge Medications: Allergies as of 11/24/2016   No Known Allergies     Medication List    STOP taking these medications   guaiFENesin 200 MG tablet     TAKE these medications   albuterol (2.5 MG/3ML) 0.083% nebulizer solution Commonly known as:  PROVENTIL Take 2.5 mg by nebulization every 4 (four) hours as needed for wheezing or shortness of breath.   atorvastatin 80 MG tablet Commonly known as:  LIPITOR Take 80 mg by mouth daily.   azithromycin 250 MG tablet Commonly known as:  ZITHROMAX Please take 1 tablet every day for 3 days.   budesonide-formoterol 160-4.5 MCG/ACT inhaler Commonly known as:  SYMBICORT Inhale 2 puffs into the lungs 2 (two) times daily.   buPROPion 100 MG tablet Commonly known as:  WELLBUTRIN Take 200 mg by mouth 2 (two) times daily.   fluconazole 100 MG tablet Commonly known as:  DIFLUCAN Take 1 tablet (100 mg total) by mouth daily.   FLUoxetine 20 MG capsule Commonly known as:  PROZAC Take 60 mg by mouth daily.   gabapentin 300 MG capsule Commonly known as:  NEURONTIN Take 900 mg by mouth 2 (two) times daily.   lisinopril-hydrochlorothiazide 10-12.5 MG tablet Commonly known as:  PRINZIDE,ZESTORETIC Take 1 tablet by mouth daily.   methadone 10 MG tablet Commonly known as:  DOLOPHINE Take 10 mg by mouth daily.   predniSONE 20 MG tablet Commonly known as:  DELTASONE Take 1 tablet (20 mg total) by mouth daily with breakfast.   PRESCRIPTION MEDICATION Take 0.5 tablets by mouth 2 (two) times daily. [facility]   tiotropium 18 MCG inhalation capsule Commonly known as:  SPIRIVA Place 1 capsule (18 mcg total) into inhaler and inhale daily.            Discharge Care Instructions        Start     Ordered   11/25/16 0000  azithromycin (ZITHROMAX) 250 MG tablet     11/24/16 1918   11/25/16 0000  fluconazole (DIFLUCAN) 100 MG tablet  Daily     11/24/16 1918   11/25/16 0000  predniSONE (DELTASONE) 20 MG tablet  Daily with breakfast     11/24/16 1918   11/24/16 0000  Increase activity slowly     11/24/16 1447   11/24/16 0000  Diet - low sodium heart healthy     11/24/16 1447   11/24/16 0000  Call MD for:  temperature >100.4     11/24/16 1447   11/24/16 0000  Call MD for:  difficulty breathing, headache or visual disturbances     11/24/16 1447   11/24/16 0000  Call MD for:  extreme fatigue     11/24/16 1447      Disposition and follow-up:   Austin Maynard was [facility] in Stable [facility].  At the hospital follow up visit please address:  1.  Please assess for ongoing dyspnea on exertion and productive cough. Please also assess resolution of oral candidiasis. Please encourage appropriate use of inhalers.   2.  Labs / imaging needed at time of follow-up: None  3.  Pending labs/ test needing follow-up: None   Follow-up Appointments: Follow-up Information    [facility], [facility] Schedule an appointment as soon as possible for a visit.   Contact information: [address] [facility] [address] [place] [address] [phone]           Hospital Course by problem list:   Austin Maynard is a 66 y.o. 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  1. COPD exacerbation: Patient presented with progressive dyspnea and productive cough for 3 weeks. He was initially placed on 8L O2 by Snyder in the ED with adequate oxygen  saturation. Unclear if patient was hypoxic on presentation. He was escalated to BiPAP in the ED per his request and remained on it for a few hours with adequate saturation as well. Respiratory status remained stable at room air during his stay. He was treated with scheduled duonebs and started on azithromycin for 5 days and prednisone. Per patient request, he was discharged on a steroid taper. He was instructed to do prednisone 40 mg x 3 days, 20 mg x 1 week, and 10 mg until he follows up with PCP at [place].   2. Oral candidiasis: Patient found to have white plaques on oropharyngeal mucosa consistent with oral candidiasis. This was thought to be secondary to excessive use of ICS inhaler at home. Patient was instructed to use his inhalers as prescribed.   3. Alcohol use disorder: Patient was placed on CIWA protocol and required Ativan 1 mg x1 for CIWA of 14. No seizure activity of DT noted during this admission.   Patient was continued on all his home medications for hypertension, chronic back pain, and depression/anxiety.   Discharge Vitals:   BP (!) 152/88 (BP Location: Left Arm)   Pulse (!) 106   Temp 97.8 F (36.6 C) (Oral)   Resp 18   Ht 6' (1.829 m)   Wt 244 lb 11.4 oz (111 kg)   SpO2 94%   BMI 33.19 kg/m   Pertinent Labs, Studies, and Procedures:   CXR 9/9: FINDINGS: Cardiomediastinal silhouette is normal. No pleural effusions or focal consolidations. Hyperinflation with flattened hemidiaphragms. Trachea projects midline and there is no pneumothorax. Soft tissue planes and included osseous structures are non-suspicious. ACDF.  Discharge Instructions: Discharge Instructions    Call MD for:  difficulty breathing, headache or visual disturbances    Complete by:  As directed    Call MD for:  extreme fatigue    Complete by:  As directed    Call MD for:  temperature >100.4    Complete by:  As directed    Diet - low sodium heart healthy    Complete by:  As directed    Increase activity  slowly    Complete by:  As directed       Signed: Burna Cash, MD  Internal Medicine PGY-1  P [phone]

## 2016-11-24 NOTE — Discharge Summary (Signed)
Pt discharge instructions were given on medications, and follow up info. Pt was given prescriptions. Friend is picking up the pt.

## 2016-11-24 NOTE — Discharge Instructions (Addendum)
You were admitted to [facility] for a COPD exacerbation.   Continue using you albuterol at home only as needed when you feel short of breath. Continue to use Spiriva as well for your COPD.   Please continue to take prednisone: take 2 tablets for 3 days, from 9/11-9/13. Then take 1 tablet 1 week. Then take half a tablet for 2 weeks or until you follow up with your primary care doctor at [facility].   Continue taking azithromycin for 3 days. Take 1 tablet every day for 3 days.

## 2016-11-24 NOTE — Progress Notes (Signed)
RT instructed pt on the use of flutter valve.  Pt able to demonstrate back good technique. 

## 2016-11-24 NOTE — Progress Notes (Signed)
   Subjective:  CIWA 14 overnight and Ativan given. No seizure activity or hallucinations. Patient was resting comfortable when seen. He was on room air and satting 200%. He states he does not know if his breathing is better and reports shortness of breath on exertion. Continues to endorse a productive cough. No other complaints this morning. He was informed of the plan, and stated he wants a longer course of steroids.   Objective:  Vital signs in last 24 hours: Vitals:   11/23/16 1721 11/23/16 1955 11/23/16 2013 11/24/16 0456  BP:   (!) 158/87 135/63  Pulse:   (!) 111 (!) 105  Resp:   18 19  Temp:   97.9 F (36.6 C) 98.2 F (36.8 C)  TempSrc:   Oral Oral  SpO2: 92% 93% 92% 98%  Weight:   244 lb 11.4 oz (111 kg)   Height:       General: sleeping in bed in no acute distress  HENT: white plaques noted on OP mucosa  Cardiac: regular rate and rhythm, nl S1/S2, no murmurs, rubs or gallops  Pulm: coarse breath sounds at the bases with scattered expiratory wheezes, no increased work of breathing while on room air Abd: soft, NTND, bowel sounds present  Ext: warm and well perfused, no peripheral edema  No lab abnormalities.   Assessment/Plan:  Rande Bruntaniel L. Geanie Berlinumphrey is a 66 y.o. 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  # COPD exacerbation: Continues to complain of dyspnea on exertion and productive cough. He is hemodynamically stable and satting well on room air.  - Scheduled duonebs q6h  - Albuterol q4h PRN  - Start Prednisone 40 mg x4 days. Will plan to do a long steroid taper per pt request  - Azithromycin 250 mg daily x5 days. Day 2 today.   # Oral candidiasis: white plaques noted on OP mucosa likely from overuse of inhalers at home  - PO fluconazole 100 mg x14 days  #Hyponatremia: Na stable at 131  - Will continue to monitor   # HTN: Normotensive  - Continue home  lisinopril-HCTZ 10-12.5 mg daily   # Chronic back pain: on methadone 10 mg daily  - Continue home methadone 10 mg daily + gabapentin 900 mg BID   # Alcohol use disorder: No history of withdrawals symptoms, per patient. CIWA 14 overnight and Ativan given. No seizure activity or hallucinations - CIWA protocol with Ativan ordered - Folate 1 mg daily + IV thiamine 100 mg x1 followed by PO thiamine 100mg  daily   # Depression/anxiety:  - Continue home Prozac 60 mg daily and Wellbutrin 200 mg BID   # ?CAD s/p stent 2000 - Continue home atorvastatin 80 mg daily   F: None  E: Will monitor and replete as needed  N: Regular diet VTE ppx: SQ Lovenox  Code status: DNR/DNI, confirmed on admission   Dispo: Anticipated discharge in approximately 1-2 day(s).   Burna CashIdalys Santos-Sanchez, MD  Internal Medicine PGY-1  P [phone]

## 2016-11-24 NOTE — Plan of Care (Signed)
Problem: Respiratory: Goal: Ability to maintain a clear airway will improve Outcome: Progressing Pt is aware of the need to take scheduled medications to prevent exacerbation of wheezing. Pt had less wheezing today.

## 2017-01-03 ENCOUNTER — Other Ambulatory Visit (HOSPITAL_COMMUNITY): Payer: Self-pay | Admitting: *Deleted

## 2017-01-03 ENCOUNTER — Ambulatory Visit (HOSPITAL_COMMUNITY)
Admission: RE | Admit: 2017-01-03 | Discharge: 2017-01-03 | Disposition: A | Discharge status: Home / Self Care | Payer: Medicare Other | Source: Ambulatory Visit | Attending: Diagnostic Radiology | Admitting: Diagnostic Radiology

## 2017-01-03 DIAGNOSIS — M795 Residual foreign body in soft tissue: Secondary | ICD-10-CM

## 2017-01-03 DIAGNOSIS — W3400XA Accidental discharge from unspecified firearms or gun, initial encounter: Secondary | ICD-10-CM

## 2017-01-15 DEATH — deceased

## 2018-04-07 IMAGING — CR DG CHEST 1V PORT
1 series · 1 of 1 positions shown · non-contrast
Comparison: None.

CLINICAL DATA: Shortness of breath, difficulty breathing for
several weeks

EXAM:
PORTABLE CHEST 1 VIEW

[ap]
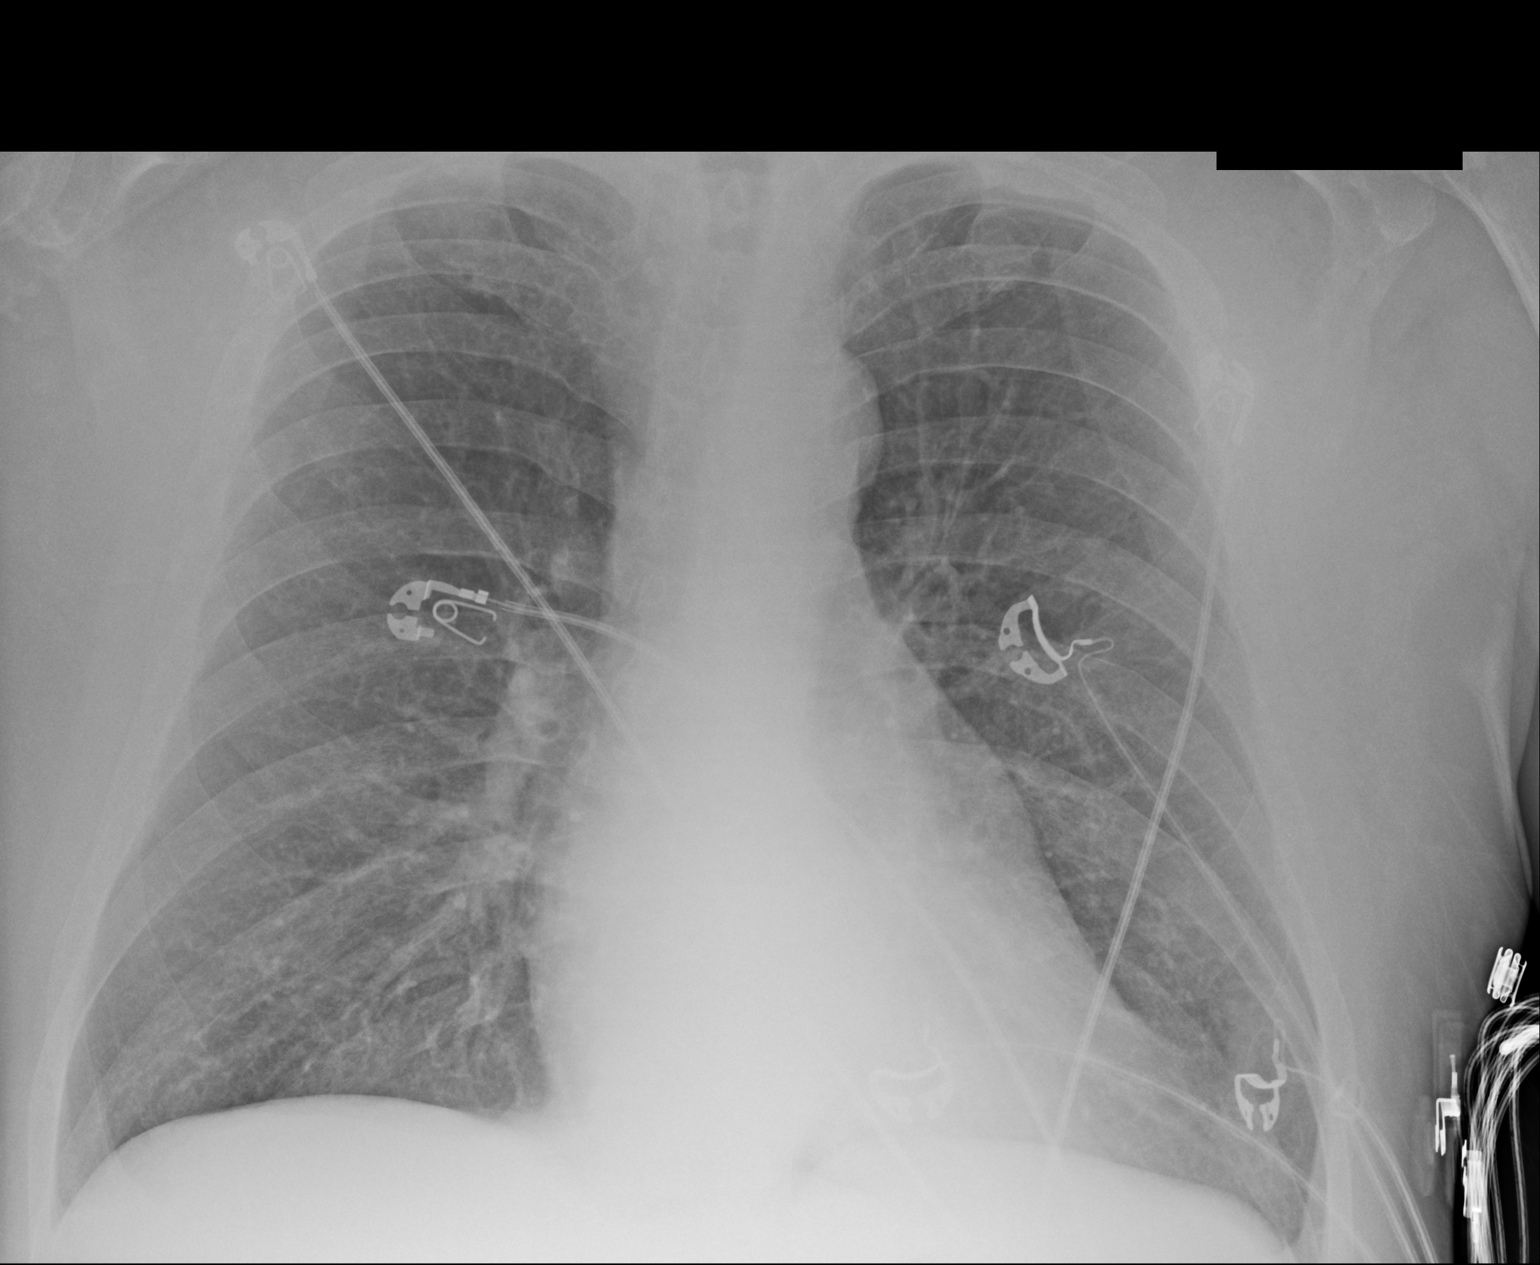

[1 of 1 positions shown; findings below may reference images not displayed]

FINDINGS: Mild hyperinflation. Heart is normal size. No confluent opacities or
effusions. No acute bony abnormality.
IMPRESSION: Mild hyperinflation.  No active disease.
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
